# Patient Record
Sex: Male | Born: 1955 | Hispanic: Yes | Marital: Married | Smoking: Never smoker
Health system: Southern US, Community
[De-identification: ages and names within clinical notes are randomized; demographics above are authoritative.]

## PROBLEM LIST (undated history)

## (undated) DIAGNOSIS — N400 Enlarged prostate without lower urinary tract symptoms: Secondary | ICD-10-CM

## (undated) DIAGNOSIS — T7840XA Allergy, unspecified, initial encounter: Secondary | ICD-10-CM

## (undated) DIAGNOSIS — Z8719 Personal history of other diseases of the digestive system: Secondary | ICD-10-CM

## (undated) DIAGNOSIS — E785 Hyperlipidemia, unspecified: Secondary | ICD-10-CM

## (undated) DIAGNOSIS — R350 Frequency of micturition: Secondary | ICD-10-CM

## (undated) HISTORY — PX: TONSILLECTOMY: SUR1361

## (undated) HISTORY — DX: Allergy, unspecified, initial encounter: T78.40XA

## (undated) HISTORY — DX: Hyperlipidemia, unspecified: E78.5

## (undated) HISTORY — PX: SHOULDER SURGERY: SHX246

## (undated) HISTORY — PX: EYE SURGERY: SHX253

---

## 2012-12-17 ENCOUNTER — Ambulatory Visit (INDEPENDENT_AMBULATORY_CARE_PROVIDER_SITE_OTHER): Payer: Managed Care, Other (non HMO) | Admitting: Emergency Medicine

## 2012-12-17 VITALS — BP 110/70 | HR 72 | Temp 98.1°F | Resp 16 | Ht 64.25 in | Wt 139.0 lb

## 2012-12-17 DIAGNOSIS — N400 Enlarged prostate without lower urinary tract symptoms: Secondary | ICD-10-CM

## 2012-12-17 DIAGNOSIS — E782 Mixed hyperlipidemia: Secondary | ICD-10-CM

## 2012-12-17 DIAGNOSIS — Z23 Encounter for immunization: Secondary | ICD-10-CM

## 2012-12-17 DIAGNOSIS — Z0000 Encounter for general adult medical examination without abnormal findings: Secondary | ICD-10-CM

## 2012-12-17 LAB — LIPID PANEL
Cholesterol: 214 mg/dL — ABNORMAL HIGH (ref 0–200)
LDL Cholesterol: 126 mg/dL — ABNORMAL HIGH (ref 0–99)
Total CHOL/HDL Ratio: 3.2 Ratio
Triglycerides: 108 mg/dL (ref <150)
VLDL: 22 mg/dL (ref 0–40)

## 2012-12-17 LAB — POCT URINALYSIS DIPSTICK
Blood, UA: NEGATIVE
Ketones, UA: NEGATIVE
Leukocytes, UA: NEGATIVE
Protein, UA: NEGATIVE
Urobilinogen, UA: 0.2
pH, UA: 8

## 2012-12-17 LAB — POCT CBC
Granulocyte percent: 56.3 %G (ref 37–80)
HCT, POC: 49 % (ref 43.5–53.7)
Lymph, poc: 1.9 (ref 0.6–3.4)
MCH, POC: 32.3 pg — AB (ref 27–31.2)
MCHC: 33.1 g/dL (ref 31.8–35.4)
MID (cbc): 0.5 (ref 0–0.9)
MPV: 10.2 fL (ref 0–99.8)
POC Granulocyte: 3 (ref 2–6.9)
POC LYMPH PERCENT: 34.5 %L (ref 10–50)
POC MID %: 9.2 %M (ref 0–12)
Platelet Count, POC: 234 10*3/uL (ref 142–424)
RDW, POC: 11.7 %

## 2012-12-17 LAB — PSA: PSA: 2.51 ng/mL (ref <=4.00)

## 2012-12-17 LAB — COMPREHENSIVE METABOLIC PANEL
ALT: 34 U/L (ref 0–53)
Alkaline Phosphatase: 56 U/L (ref 39–117)
CO2: 26 mEq/L (ref 19–32)
Creat: 0.81 mg/dL (ref 0.50–1.35)
Potassium: 4 mEq/L (ref 3.5–5.3)
Sodium: 139 mEq/L (ref 135–145)
Total Bilirubin: 1.1 mg/dL (ref 0.3–1.2)
Total Protein: 7.4 g/dL (ref 6.0–8.3)

## 2012-12-17 MED ORDER — ATORVASTATIN CALCIUM 40 MG PO TABS: 40.0000 mg | ORAL_TABLET | Freq: Every day | ORAL | Status: DC

## 2012-12-17 NOTE — Addendum Note (Signed)
Addended by: Carmelina Dane on: 12/17/2012 06:20 PM   Modules accepted: Orders

## 2012-12-17 NOTE — Addendum Note (Signed)
Addended byLevon Hedger A on: 12/17/2012 10:53 AM   Modules accepted: Orders

## 2012-12-17 NOTE — Patient Instructions (Signed)
Lipid Blood Tests Blood lipids are fats found in the circulation. Cholesterol and triglycerides are the two main lipids measured for determining your risk of getting heart and blood vessel diseases. The cholesterol in the blood is attached to two different molecules called lipoproteins. HDL is the beneficial high density lipoprotein cholesterol which reduces coronary risk. Low density lipoprotein cholesterol, the LDL, carries an increased risk for heart and vascular disease when it is high. Most of the body's fat tissue is in the form of triglycerides. Medical conditions that elevate the blood triglyceride level include diabetes, thyroid, kidney, and liver diseases.  A lipid panel usually includes the total cholesterol, LDL, and HDL blood levels. For best results, you should fast overnight before the blood tests are drawn. The following risk factors are generally accepted for different lipids:  LDL - Below 100 means low risk, 130-160 borderline risk, 160-190 high risk, above 190 is considered very high risk.  HDL - Below 40 means high risk, 40-60 average risk, 60 and higher means a reduced risk for heart and blood vessel diseases.  Total cholesterol - Below 200 means low risk, 200-240 is borderline risk, above 240 is higher risk.  Triglycerides - Below 200 means low risk, 200-400 borderline risk, above 400 means increased risk. Many factors contribute to lipid levels including genetics, diet, exercise, body fat and medications. Reducing saturated fats in the diet and increasing fiber with fruits, vegetables and whole grains have been shown to improve blood lipids. Drugs may be prescribed to lower lipids if diet and exercise alone do not help bring the cholesterol levels under control. Document Released: 03/23/2004 Document Revised: 05/08/2011 Document Reviewed: 02/13/2005 [facility] Patient Information 2014 [place], [place].

## 2012-12-17 NOTE — Progress Notes (Signed)
[facility] [address], [place] [address] [phone]  Date:  12/17/2012   Name:  Robert Edwards   DOB:  03-01-1955   MRN:  [id number]  PCP:  Provider Not In System    Chief Complaint: Annual Exam   History of Present Illness:  Robert Edwards is a 57 y.o. very pleasant male patient who presents with the following:  History of hypercholesterolemia and BPH. Is out of his medication as his doctor retired.  No adverse effect of medication.  Has some urge incontinence and has appt with urologist later this month.  Had colonoscopy 2 years ago and 3 polyps were removed.  Not due for redo until 2017.  Denies other complaint or health concern today.   Requests flu shot.  Non smoker.  Works in [employer].  There are no active problems to display for this patient.   Past Medical History  Diagnosis Date  . Allergy     Past Surgical History  Procedure Laterality Date  . Eye surgery    . Tonsillectomy    . Shoulder surgery      History  Substance Use Topics  . Smoking status: Never Smoker   . Smokeless tobacco: Not on file  . Alcohol Use: Not on file    Family History  Problem Relation Age of Onset  . Hypertension Mother   . Heart attack Father   . Cancer Maternal Grandmother     No Known Allergies  Medication list has been reviewed and updated.  No current outpatient prescriptions on file prior to visit.   No current facility-administered medications on file prior to visit.    Review of Systems:  As per HPI, otherwise negative.    Physical Examination: Filed Vitals:   12/17/12 1011  BP: 110/70  Pulse: 72  Temp: 98.1 F (36.7 C)  Resp: 16   Filed Vitals:   12/17/12 1011  Height: 5' 4.25" (1.632 m)  Weight: 139 lb (63.05 kg)   Body mass index is 23.67 kg/(m^2). Ideal Body Weight: Weight in (lb) to have BMI = 25: 146.5  GEN: WDWN, NAD, Non-toxic, A & O x 3 HEENT: Atraumatic, Normocephalic. Neck supple. No masses, No  LAD. Ears and Nose: No external deformity. CV: RRR, No M/G/R. No JVD. No thrill. No extra heart sounds. PULM: CTA B, no wheezes, crackles, rhonchi. No retractions. No resp. distress. No accessory muscle use. ABD: S, NT, ND, +BS. No rebound. No HSM. EXTR: No c/c/e NEURO Normal gait.  PSYCH: Normally interactive. Conversant. Not depressed or anxious appearing.  Calm demeanor.    Assessment and Plan: Wellness examination Labs  Signed,  Phillips Odor, MD

## 2013-01-10 ENCOUNTER — Telehealth: Payer: Self-pay | Admitting: Radiology

## 2013-01-10 ENCOUNTER — Telehealth: Payer: Self-pay

## 2013-01-10 NOTE — Telephone Encounter (Signed)
Faxed PSA to urology at [facility] per their request.

## 2013-01-10 NOTE — Telephone Encounter (Signed)
PATIENT STATES HE HAD A COMPLETE PHYSICAL WITH DR. ANDERSON ABOUT 3 WEEKS AGO. HE RECEIVED A NORMAL LAB LETTER AND IT SAID THAT HIS PSA WAS STILL PENDING AND THAT WE WOULD CONTACT HIM WITH THOSE RESULTS. HOWEVER, HE HAS NEVER HEARD BACK FROM [place]. HE WOULD LIKE TO GET THE RESULTS OF THAT TEST AS WELL. BEST PHONE [phone] (CELL)  PHARMACY CHOICE IS [facility] ON [address] IN [place], [place].   MBC

## 2013-01-10 NOTE — Telephone Encounter (Signed)
PSA normal 2.5 called him to advise.

## 2013-06-24 ENCOUNTER — Ambulatory Visit (INDEPENDENT_AMBULATORY_CARE_PROVIDER_SITE_OTHER): Payer: Managed Care, Other (non HMO) | Admitting: Urology

## 2013-06-24 DIAGNOSIS — N3941 Urge incontinence: Secondary | ICD-10-CM

## 2013-06-24 DIAGNOSIS — N401 Enlarged prostate with lower urinary tract symptoms: Secondary | ICD-10-CM

## 2013-06-24 DIAGNOSIS — R3915 Urgency of urination: Secondary | ICD-10-CM

## 2013-06-24 DIAGNOSIS — N138 Other obstructive and reflux uropathy: Secondary | ICD-10-CM

## 2013-07-02 ENCOUNTER — Other Ambulatory Visit: Payer: Self-pay | Admitting: Emergency Medicine

## 2013-07-22 ENCOUNTER — Ambulatory Visit (INDEPENDENT_AMBULATORY_CARE_PROVIDER_SITE_OTHER): Payer: Managed Care, Other (non HMO) | Admitting: Urology

## 2013-07-22 DIAGNOSIS — N401 Enlarged prostate with lower urinary tract symptoms: Secondary | ICD-10-CM

## 2013-07-22 DIAGNOSIS — N138 Other obstructive and reflux uropathy: Secondary | ICD-10-CM

## 2013-07-28 ENCOUNTER — Other Ambulatory Visit: Payer: Self-pay | Admitting: Urology

## 2013-08-08 ENCOUNTER — Other Ambulatory Visit: Payer: Self-pay | Admitting: Physician Assistant

## 2013-08-10 ENCOUNTER — Other Ambulatory Visit: Payer: Self-pay | Admitting: Physician Assistant

## 2013-08-13 ENCOUNTER — Ambulatory Visit (INDEPENDENT_AMBULATORY_CARE_PROVIDER_SITE_OTHER): Payer: Managed Care, Other (non HMO) | Admitting: Emergency Medicine

## 2013-08-13 VITALS — BP 124/78 | HR 85 | Temp 97.8°F | Resp 18 | Ht 65.0 in | Wt 144.0 lb

## 2013-08-13 DIAGNOSIS — E785 Hyperlipidemia, unspecified: Secondary | ICD-10-CM

## 2013-08-13 LAB — LIPID PANEL
CHOL/HDL RATIO: 3.7 ratio
Cholesterol: 183 mg/dL (ref 0–200)
HDL: 50 mg/dL (ref >39)
LDL CALC: 95 mg/dL (ref 0–99)
Triglycerides: 189 mg/dL — ABNORMAL HIGH (ref <150)
VLDL: 38 mg/dL (ref 0–40)

## 2013-08-13 LAB — COMPREHENSIVE METABOLIC PANEL
ALK PHOS: 70 U/L (ref 39–117)
ALT: 28 U/L (ref 0–53)
AST: 23 U/L (ref 0–37)
Albumin: 4.6 g/dL (ref 3.5–5.2)
BUN: 15 mg/dL (ref 6–23)
CO2: 25 mEq/L (ref 19–32)
CREATININE: 0.81 mg/dL (ref 0.50–1.35)
Calcium: 9.4 mg/dL (ref 8.4–10.5)
Chloride: 102 mEq/L (ref 96–112)
Glucose, Bld: 89 mg/dL (ref 70–99)
Potassium: 4.1 mEq/L (ref 3.5–5.3)
Sodium: 135 mEq/L (ref 135–145)
Total Bilirubin: 0.9 mg/dL (ref 0.2–1.2)
Total Protein: 7.4 g/dL (ref 6.0–8.3)

## 2013-08-13 LAB — CBC WITH DIFFERENTIAL/PLATELET
BASOS ABS: 0.1 10*3/uL (ref 0.0–0.1)
Basophils Relative: 1 % (ref 0–1)
EOS PCT: 5 % (ref 0–5)
Eosinophils Absolute: 0.3 10*3/uL (ref 0.0–0.7)
HCT: 43.2 % (ref 39.0–52.0)
Hemoglobin: 15.1 g/dL (ref 13.0–17.0)
Lymphocytes Relative: 33 % (ref 12–46)
Lymphs Abs: 1.7 10*3/uL (ref 0.7–4.0)
MCH: 31.6 pg (ref 26.0–34.0)
MCHC: 35 g/dL (ref 30.0–36.0)
MCV: 90.4 fL (ref 78.0–100.0)
Monocytes Absolute: 0.5 10*3/uL (ref 0.1–1.0)
Monocytes Relative: 10 % (ref 3–12)
NEUTROS ABS: 2.7 10*3/uL (ref 1.7–7.7)
Neutrophils Relative %: 51 % (ref 43–77)
Platelets: 231 10*3/uL (ref 150–400)
RBC: 4.78 MIL/uL (ref 4.22–5.81)
RDW: 13 % (ref 11.5–15.5)
WBC: 5.3 10*3/uL (ref 4.0–10.5)

## 2013-08-13 MED ORDER — ATORVASTATIN CALCIUM 40 MG PO TABS: ORAL_TABLET | ORAL | Status: DC

## 2013-08-13 NOTE — Progress Notes (Signed)
   Subjective:    Patient ID: Robert Edwards, male    DOB: 25-Sep-1955, 58 y.o.   MRN: [id number]  HPI Chief Complaint  Patient presents with  . rx refills    lipitor    This chart was scribed for Robert ChrisSteven Daub, MD by Andrew Auaven Small, ED Scribe. This patient was seen in room 11 and the patient's care was started at 8:21 AM.  HPI Comments: Robert Edwards is a 58 y.o. male who [facility] for a medication refill regarding lipitor. Pt reports last CBC was October 2014 and states in order for him to get a refill he needs his blood work done. Pt takes lipitor regularly and is running out. Pt states he watches his diets. Pt denies CP and myalgias. He reports cramping to his feet but states he works standing up and outside. Pt stays hydrated. Pt has an enlarged prostate and is soon to have surgery.    There are no active problems to display for this patient.  Past Medical History  Diagnosis Date  . Allergy   . Hyperlipidemia    No Known Allergies Prior to Admission medications   Medication Sig Start Date End Date Taking? Authorizing Provider  atorvastatin (LIPITOR) 40 MG tablet Take 1 tablet (40 mg total) by mouth daily. PATIENT NEEDS OFFICE VISIT FOR ADDITIONAL REFILLS 07/02/13  Yes Eleanore Delia ChimesE Egan, PA-C  finasteride (PROSCAR) 5 MG tablet Take 5 mg by mouth daily.   Yes Historical Provider, MD  tamsulosin (FLOMAX) 0.4 MG CAPS capsule Take by mouth.   Yes Historical Provider, MD   Review of Systems  Cardiovascular: Negative for chest pain.  Musculoskeletal: Negative for myalgias.       Objective:   Physical Exam CONSTITUTIONAL: Well developed/well nourished HEAD: Normocephalic/atraumatic EYES: EOMI/PERRL ENMT: Mucous membranes moist NECK: supple no meningeal signs SPINE:entire spine nontender CV: S1/S2 noted, no murmurs/rubs/gallops noted LUNGS: Lungs are clear to auscultation bilaterally, no apparent distress ABDOMEN: soft, nontender, no rebound or  guarding GU:no cva tenderness NEURO: Pt is awake/alert, moves all extremitiesx4 EXTREMITIES: pulses normal, full ROM SKIN: warm, color normal PSYCH: no abnormalities of mood noted    Assessment & Plan:  Patient presents for refill of Lipitor. Fasting lipid panel was done . He is scheduled for upcoming surgery on his prostate. I personally performed the services described in this documentation, which was scribed in my presence. The recorded information has been reviewed and is accurate.

## 2013-10-14 ENCOUNTER — Encounter (HOSPITAL_COMMUNITY): Payer: Self-pay | Admitting: Pharmacy Technician

## 2013-10-15 ENCOUNTER — Encounter (HOSPITAL_COMMUNITY)
Admission: RE | Admit: 2013-10-15 | Discharge: 2013-10-15 | Disposition: A | Discharge status: Home / Self Care | Payer: Managed Care, Other (non HMO) | Source: Ambulatory Visit | Attending: Urology | Admitting: Urology

## 2013-10-15 ENCOUNTER — Encounter (HOSPITAL_COMMUNITY): Payer: Self-pay

## 2013-10-15 DIAGNOSIS — Z01818 Encounter for other preprocedural examination: Secondary | ICD-10-CM | POA: Insufficient documentation

## 2013-10-15 DIAGNOSIS — N400 Enlarged prostate without lower urinary tract symptoms: Secondary | ICD-10-CM | POA: Diagnosis present

## 2013-10-15 HISTORY — DX: Frequency of micturition: R35.0

## 2013-10-15 HISTORY — DX: Benign prostatic hyperplasia without lower urinary tract symptoms: N40.0

## 2013-10-15 HISTORY — DX: Personal history of other diseases of the digestive system: Z87.19

## 2013-10-15 LAB — CBC
HEMATOCRIT: 43.7 % (ref 39.0–52.0)
Hemoglobin: 15.4 g/dL (ref 13.0–17.0)
MCH: 32 pg (ref 26.0–34.0)
MCHC: 35.2 g/dL (ref 30.0–36.0)
MCV: 90.9 fL (ref 78.0–100.0)
Platelets: 217 10*3/uL (ref 150–400)
RBC: 4.81 MIL/uL (ref 4.22–5.81)
RDW: 11.7 % (ref 11.5–15.5)
WBC: 4.7 10*3/uL (ref 4.0–10.5)

## 2013-10-15 NOTE — Patient Instructions (Signed)
Robert Edwards  10/15/2013                           YOUR PROCEDURE IS SCHEDULED ON: 10/23/13               ENTER THRU [facility] ENTRANCE AND                            FOLLOW  SIGNS TO SHORT STAY CENTER                 ARRIVE AT SHORT STAY AT: 5:30 AM               CALL THIS NUMBER IF ANY PROBLEMS THE DAY OF SURGERY :               [phone]                                REMEMBER:   Do not eat food or drink liquids AFTER MIDNIGHT                  Take these medicines the morning of surgery with               A SIPS OF WATER :  LIPITOR       Do not wear jewelry, make-up   Do not wear lotions, powders, or perfumes.   Do not shave legs or underarms 12 hrs. before surgery (men may shave face)  Do not bring valuables to the hospital.  Contacts, dentures or bridgework may not be worn into surgery.  Leave suitcase in the car. After surgery it may be brought to your room.  For patients admitted to the hospital more than one night, checkout time is            11:00 AM                                                       The day of discharge.   Patients discharged the day of surgery will not be allowed to drive home.            If going home same day of surgery, must have someone stay with you              FIRST 24 hrs at home and arrange for some one to drive you              home from hospital.   ________________________________________________________________________                                                                        [facility] - PREPARING FOR SURGERY  Before surgery, you can play an important role.  Because skin is not sterile, your skin needs to be as free of germs as possible.  You can reduce the number of germs on your skin by washing  with CHG (chlorahexidine gluconate) soap before surgery.  CHG is an antiseptic cleaner which kills germs and bonds with the skin to continue killing germs even after washing. Please DO NOT use  if you have an allergy to CHG or antibacterial soaps.  If your skin becomes reddened/irritated stop using the CHG and inform your nurse when you arrive at Short Stay. Do not shave (including legs and underarms) for at least 48 hours prior to the first CHG shower.  You may shave your face. Please follow these instructions carefully:   1.  Shower with CHG Soap the night before surgery and the  morning of Surgery.   2.  If you choose to wash your hair, wash your hair first as usual with your  normal  Shampoo.   3.  After you shampoo, rinse your hair and body thoroughly to remove the  shampoo.                                         4.  Use CHG as you would any other liquid soap.  You can apply chg directly  to the skin and wash . Gently wash with scrungie or clean wascloth    5.  Apply the CHG Soap to your body ONLY FROM THE NECK DOWN.   Do not use on open                           Wound or open sores. Avoid contact with eyes, ears mouth and genitals (private parts).                        Genitals (private parts) with your normal soap.              6.  Wash thoroughly, paying special attention to the area where your surgery  will be performed.   7.  Thoroughly rinse your body with warm water from the neck down.   8.  DO NOT shower/wash with your normal soap after using and rinsing off  the CHG Soap .                9.  Pat yourself dry with a clean towel.             10.  Wear clean pajamas.             11.  Place clean sheets on your bed the night of your first shower and do not  sleep with pets.  Day of Surgery : Do not apply any lotions/deodorants the morning of surgery.  Please wear clean clothes to the hospital/surgery center.  FAILURE TO FOLLOW THESE INSTRUCTIONS MAY RESULT IN THE CANCELLATION OF YOUR SURGERY    PATIENT SIGNATURE_________________________________  ______________________________________________________________________

## 2013-10-16 ENCOUNTER — Inpatient Hospital Stay (HOSPITAL_COMMUNITY): Admission: RE | Admit: 2013-10-16 | Payer: Managed Care, Other (non HMO) | Source: Ambulatory Visit

## 2013-10-22 NOTE — Anesthesia Preprocedure Evaluation (Addendum)
Anesthesia Evaluation  Patient identified by MRN, date of birth, ID band Patient awake    Reviewed: Allergy & Precautions, H&P , NPO status , Patient's Chart, lab work & pertinent test results  Airway Mallampati: II  TM Distance: >3 FB Neck ROM: full    Dental no notable dental hx. (+) Teeth Intact, Dental Advisory Given   Pulmonary neg pulmonary ROS,  breath sounds clear to auscultation  Pulmonary exam normal       Cardiovascular Exercise Tolerance: Good negative cardio ROS  Rhythm:regular Rate:Normal     Neuro/Psych negative neurological ROS  negative psych ROS   GI/Hepatic negative GI ROS, Neg liver ROS,   Endo/Other  negative endocrine ROS  Renal/GU negative Renal ROS  negative genitourinary   Musculoskeletal   Abdominal   Peds  Hematology negative hematology ROS (+)   Anesthesia Other Findings   Reproductive/Obstetrics negative OB ROS                             Anesthesia Physical Anesthesia Plan  ASA: I  Anesthesia Plan: General   Post-op Pain Management:    Induction: Intravenous  Airway Management Planned: LMA  Additional Equipment:   Intra-op Plan:   Post-operative Plan:   Informed Consent: I have reviewed the patients History and Physical, chart, labs and discussed the procedure including the risks, benefits and alternatives for the proposed anesthesia with the patient or authorized representative who has indicated his/her understanding and acceptance.   Dental Advisory Given  Plan Discussed with: CRNA and Surgeon  Anesthesia Plan Comments:         Anesthesia Quick Evaluation  

## 2013-10-23 ENCOUNTER — Encounter (HOSPITAL_COMMUNITY): Payer: Self-pay | Admitting: *Deleted

## 2013-10-23 ENCOUNTER — Encounter (HOSPITAL_COMMUNITY): Payer: Managed Care, Other (non HMO) | Admitting: Anesthesiology

## 2013-10-23 ENCOUNTER — Observation Stay (HOSPITAL_COMMUNITY)
Admission: RE | Admit: 2013-10-23 | Discharge: 2013-10-24 | Disposition: A | Discharge status: Home / Self Care | Payer: Managed Care, Other (non HMO) | Source: Ambulatory Visit | Attending: Urology | Admitting: Urology

## 2013-10-23 ENCOUNTER — Ambulatory Visit (HOSPITAL_COMMUNITY): Payer: Managed Care, Other (non HMO) | Admitting: Anesthesiology

## 2013-10-23 ENCOUNTER — Encounter (HOSPITAL_COMMUNITY)
Admission: RE | Disposition: A | Discharge status: Home / Self Care | Payer: Self-pay | Source: Ambulatory Visit | Attending: Urology

## 2013-10-23 DIAGNOSIS — N320 Bladder-neck obstruction: Secondary | ICD-10-CM | POA: Diagnosis not present

## 2013-10-23 DIAGNOSIS — N401 Enlarged prostate with lower urinary tract symptoms: Secondary | ICD-10-CM | POA: Diagnosis not present

## 2013-10-23 DIAGNOSIS — E785 Hyperlipidemia, unspecified: Secondary | ICD-10-CM | POA: Insufficient documentation

## 2013-10-23 DIAGNOSIS — N138 Other obstructive and reflux uropathy: Principal | ICD-10-CM | POA: Insufficient documentation

## 2013-10-23 DIAGNOSIS — R350 Frequency of micturition: Secondary | ICD-10-CM | POA: Insufficient documentation

## 2013-10-23 HISTORY — PX: TRANSURETHRAL RESECTION OF PROSTATE: SHX73

## 2013-10-23 SURGERY — TRANSURETHRAL RESECTION OF THE PROSTATE WITH GYRUS INSTRUMENTS
Anesthesia: General

## 2013-10-23 MED ORDER — LEVOFLOXACIN 500 MG PO TABS: 500.0000 mg | ORAL_TABLET | Freq: Every day | ORAL | Status: DC

## 2013-10-23 MED ORDER — DEXAMETHASONE SODIUM PHOSPHATE 10 MG/ML IJ SOLN
INTRAMUSCULAR | Status: AC
  Filled 2013-10-23: qty 1

## 2013-10-23 MED ORDER — DEXAMETHASONE SODIUM PHOSPHATE 10 MG/ML IJ SOLN
INTRAMUSCULAR | Status: DC | PRN
  Administered 2013-10-23: 10 mg via INTRAVENOUS

## 2013-10-23 MED ORDER — MIDAZOLAM HCL 2 MG/2ML IJ SOLN
INTRAMUSCULAR | Status: AC
  Filled 2013-10-23: qty 2

## 2013-10-23 MED ORDER — ONDANSETRON HCL 4 MG/2ML IJ SOLN: 4.0000 mg | INTRAMUSCULAR | Status: DC | PRN

## 2013-10-23 MED ORDER — ONDANSETRON HCL 4 MG/2ML IJ SOLN
INTRAMUSCULAR | Status: AC
  Filled 2013-10-23: qty 2

## 2013-10-23 MED ORDER — DEXTROSE-NACL 5-0.45 % IV SOLN
INTRAVENOUS | Status: DC
  Administered 2013-10-23: 11:00:00 via INTRAVENOUS

## 2013-10-23 MED ORDER — MIDAZOLAM HCL 5 MG/5ML IJ SOLN
INTRAMUSCULAR | Status: DC | PRN
  Administered 2013-10-23: 2 mg via INTRAVENOUS

## 2013-10-23 MED ORDER — FENTANYL CITRATE 0.05 MG/ML IJ SOLN
INTRAMUSCULAR | Status: AC
  Filled 2013-10-23: qty 5

## 2013-10-23 MED ORDER — BELLADONNA ALKALOIDS-OPIUM 16.2-60 MG RE SUPP
1.0000 | Freq: Four times a day (QID) | RECTAL | Status: DC | PRN
Start: 2013-10-23 — End: 2013-10-24

## 2013-10-23 MED ORDER — LIDOCAINE HCL (CARDIAC) 20 MG/ML IV SOLN
INTRAVENOUS | Status: AC
  Filled 2013-10-23: qty 5

## 2013-10-23 MED ORDER — ONDANSETRON HCL 4 MG/2ML IJ SOLN
INTRAMUSCULAR | Status: DC | PRN
  Administered 2013-10-23: 4 mg via INTRAVENOUS

## 2013-10-23 MED ORDER — LIDOCAINE HCL (CARDIAC) 20 MG/ML IV SOLN
INTRAVENOUS | Status: DC | PRN
  Administered 2013-10-23: 60 mg via INTRAVENOUS

## 2013-10-23 MED ORDER — ACETAMINOPHEN 325 MG PO TABS: 650.0000 mg | ORAL_TABLET | ORAL | Status: DC | PRN

## 2013-10-23 MED ORDER — PROPOFOL 10 MG/ML IV BOLUS
INTRAVENOUS | Status: DC | PRN
  Administered 2013-10-23: 160 mg via INTRAVENOUS

## 2013-10-23 MED ORDER — LEVOFLOXACIN 500 MG PO TABS
500.0000 mg | ORAL_TABLET | Freq: Every day | ORAL | Status: DC
  Administered 2013-10-23 – 2013-10-24 (×2): 500 mg via ORAL
  Filled 2013-10-23 (×3): qty 1

## 2013-10-23 MED ORDER — FENTANYL CITRATE 0.05 MG/ML IJ SOLN
INTRAMUSCULAR | Status: AC
  Filled 2013-10-23: qty 2

## 2013-10-23 MED ORDER — PROPOFOL 10 MG/ML IV BOLUS
INTRAVENOUS | Status: AC
  Filled 2013-10-23: qty 20

## 2013-10-23 MED ORDER — LACTATED RINGERS IV SOLN: INTRAVENOUS | Status: DC

## 2013-10-23 MED ORDER — SODIUM CHLORIDE 0.9 % IR SOLN
Status: DC | PRN
  Administered 2013-10-23: 21000 mL

## 2013-10-23 MED ORDER — HYDROCODONE-ACETAMINOPHEN 5-325 MG PO TABS
1.0000 | ORAL_TABLET | ORAL | Status: DC | PRN
  Administered 2013-10-23: 1 via ORAL
  Filled 2013-10-23: qty 1

## 2013-10-23 MED ORDER — ATORVASTATIN CALCIUM 40 MG PO TABS
40.0000 mg | ORAL_TABLET | Freq: Every day | ORAL | Status: DC
  Administered 2013-10-23 – 2013-10-24 (×2): 40 mg via ORAL
  Filled 2013-10-23 (×3): qty 1

## 2013-10-23 MED ORDER — DOCUSATE SODIUM 100 MG PO CAPS
100.0000 mg | ORAL_CAPSULE | Freq: Two times a day (BID) | ORAL | Status: DC
  Administered 2013-10-23 – 2013-10-24 (×3): 100 mg via ORAL
  Filled 2013-10-23 (×5): qty 1

## 2013-10-23 MED ORDER — SODIUM CHLORIDE 0.9 % IR SOLN
3000.0000 mL | Status: DC
  Administered 2013-10-23: 3000 mL

## 2013-10-23 MED ORDER — CEFAZOLIN SODIUM-DEXTROSE 2-3 GM-% IV SOLR
2.0000 g | INTRAVENOUS | Status: AC
  Administered 2013-10-23: 2 g via INTRAVENOUS

## 2013-10-23 MED ORDER — CEFAZOLIN SODIUM-DEXTROSE 2-3 GM-% IV SOLR
INTRAVENOUS | Status: AC
  Filled 2013-10-23: qty 50

## 2013-10-23 MED ORDER — EPHEDRINE SULFATE 50 MG/ML IJ SOLN
INTRAMUSCULAR | Status: AC
  Filled 2013-10-23: qty 1

## 2013-10-23 MED ORDER — FENTANYL CITRATE 0.05 MG/ML IJ SOLN
25.0000 ug | INTRAMUSCULAR | Status: DC | PRN
  Administered 2013-10-23 (×4): 25 ug via INTRAVENOUS

## 2013-10-23 MED ORDER — FENTANYL CITRATE 0.05 MG/ML IJ SOLN
INTRAMUSCULAR | Status: DC | PRN
  Administered 2013-10-23: 25 ug via INTRAVENOUS
  Administered 2013-10-23: 50 ug via INTRAVENOUS
  Administered 2013-10-23 (×2): 25 ug via INTRAVENOUS

## 2013-10-23 MED ORDER — LACTATED RINGERS IV SOLN
INTRAVENOUS | Status: DC | PRN
  Administered 2013-10-23: 07:00:00 via INTRAVENOUS

## 2013-10-23 MED ORDER — SODIUM CHLORIDE 0.9 % IJ SOLN
INTRAMUSCULAR | Status: AC
  Filled 2013-10-23: qty 10

## 2013-10-23 SURGICAL SUPPLY — 19 items
BAG URINE DRAINAGE (UROLOGICAL SUPPLIES) ×3 IMPLANT
BAG URO CATCHER STRL LF (DRAPE) ×3 IMPLANT
CATH FOLEY 3WAY 30CC 22FR (CATHETERS) ×3 IMPLANT
DRAPE CAMERA CLOSED 9X96 (DRAPES) ×3 IMPLANT
ELECT BUTTON HF 24-28F 2 30DE (ELECTRODE) IMPLANT
ELECT HF RESECT BIPO 24F 45 ND (CUTTING LOOP) IMPLANT
ELECT LOOP MED HF 24F 12D (CUTTING LOOP) ×3 IMPLANT
ELECT LOOP MED HF 24F 12D CBL (CLIP) ×3 IMPLANT
EVACUATOR MICROVAS BLADDER (UROLOGICAL SUPPLIES) ×3 IMPLANT
GLOVE BIOGEL M 8.0 STRL (GLOVE) ×3 IMPLANT
GOWN STRL REUS W/TWL XL LVL3 (GOWN DISPOSABLE) ×3 IMPLANT
HOLDER FOLEY CATH W/STRAP (MISCELLANEOUS) ×3 IMPLANT
IV NS IRRIG 3000ML ARTHROMATIC (IV SOLUTION) IMPLANT
KIT ASPIRATION TUBING (SET/KITS/TRAYS/PACK) ×3 IMPLANT
MANIFOLD NEPTUNE II (INSTRUMENTS) ×3 IMPLANT
PACK CYSTO (CUSTOM PROCEDURE TRAY) ×3 IMPLANT
SYR 30ML LL (SYRINGE) ×3 IMPLANT
TUBING CONNECTING 10 (TUBING) ×2 IMPLANT
TUBING CONNECTING 10' (TUBING) ×1

## 2013-10-23 NOTE — Anesthesia Postprocedure Evaluation (Signed)
  Anesthesia Post-op Note  Patient: Robert Edwards  Procedure(s) Performed: Procedure(s) (LRB): TRANSURETHRAL RESECTION OF THE PROSTATE WITH GYRUS INSTRUMENTS (N/A)  Patient Location: PACU  Anesthesia Type: General  Level of Consciousness: awake and alert   Airway and Oxygen Therapy: Patient Spontanous Breathing  Post-op Pain: mild  Post-op Assessment: Post-op Vital signs reviewed, Patient's Cardiovascular Status Stable, Respiratory Function Stable, Patent Airway and No signs of Nausea or vomiting  Last Vitals:  Filed Vitals:   10/23/13 0945  BP:   Pulse:   Temp: 36.5 C  Resp:     Post-op Vital Signs: stable   Complications: No apparent anesthesia complications

## 2013-10-23 NOTE — Discharge Instructions (Signed)
Transurethral Resection of the Prostate ° °Care After ° °Refer to this sheet in the next few weeks. These discharge instructions provide you with general information on caring for yourself after you leave the hospital. Your caregiver may also give you specific instructions. Your treatment has been planned according to the most current medical practices available, but unavoidable complications sometimes occur. If you have any problems or questions after discharge, please call your caregiver. ° °HOME CARE INSTRUCTIONS  ° °Medications °· You may receive medicine for pain management. As your level of discomfort decreases, adjustments in your pain medicines may be made.  °· Take all medicines as directed.  °· You may be given a medicine (antibiotic) to kill germs following surgery. Finish all medicines. Let your caregiver know if you have any side effects or problems from the medicine.  °· If you are on aspirin, it would be best not to restart the aspirin until the blood in the urine clears °Hygiene °· You can take a shower after surgery.  °· You should not take a bath while you still have the urethral catheter. °Activity °· You will be encouraged to get out of bed as much as possible and increase your activity level as tolerated.  °· Spend the first week in and around your home. For 3 weeks, avoid the following:  °· Straining.  °· Running.  °· Strenuous work.  °· Walks longer than a few blocks.  °· Riding for extended periods.  °· Sexual relations.  °· Do not lift heavy objects (more than 20 pounds) for at least 1 month. When lifting, use your arms instead of your abdominal muscles.  °· You will be encouraged to walk as tolerated. Do not exert yourself. Increase your activity level slowly. Remember that it is important to keep moving after an operation of any type. This cuts down on the possibility of developing blood clots.  °· Your caregiver will tell you when you can resume driving and light housework. Discuss this  at your first office visit after discharge. °Diet °· No special diet is ordered after a TURP. However, if you are on a special diet for another medical problem, it should be continued.  °· Normal fluid intake is usually recommended.  °· Avoid alcohol and caffeinated drinks for 2 weeks. They irritate the bladder. Decaffeinated drinks are okay.  °· Avoid spicy foods.  °Bladder Function °· For the first 10 days, empty the bladder whenever you feel a definite desire. Do not try to hold the urine for long periods of time.  °· Urinating once or twice a night even after you are healed is not uncommon.  °· You may see some recurrence of blood in the urine after discharge from the hospital. This usually happens within 2 weeks after the procedure.If this occurs, force fluids again as you did in the hospital and reduce your activity.  °Bowel Function °· You may experience some constipation after surgery. This can be minimized by increasing fluids and fiber in your diet. Drink enough water and fluids to keep your urine clear or pale yellow.  °· A stool softener may be prescribed for use at home. Do not strain to move your bowels.  °· If you are requiring increased pain medicine, it is important that you take stool softeners to prevent constipation. This will help to promote proper healing by reducing the need to strain to move your bowels.  °Sexual Activity °· Semen movement in the opposite direction and into the bladder (  retrograde ejaculation) may occur. Since the semen passes into the bladder, cloudy urine can occur the first time you urinate after intercourse. Or, you may not have an ejaculation during erection. Ask your caregiver when you can resume sexual activity. Retrograde ejaculation and reduced semen discharge should not reduce one's pleasure of intercourse.  °Postoperative Visit °· Arrange the date and time of your after surgery visit with your caregiver.  °Return to Work °· After your recovery is complete, you will  be able to return to work and resume all activities. Your caregiver will inform you when you can return to work.  °Foley Catheter Care °A soft, flexible tube (Foley catheter) may have been placed in your bladder to drain urine and fluid. Follow these instructions: °Taking Care of the Catheter °· Keep the area where the catheter leaves your body clean.  °· Attach the catheter to the leg so there is no tension on the catheter.  °· Keep the drainage bag below the level of the bladder, but keep it OFF the floor.  °· Do not take long soaking baths. Your caregiver will give instructions about showering.  °· Wash your hands before touching ANYTHING related to the catheter or bag.  °· Using mild soap and warm water on a washcloth:  °· Clean the area closest to the catheter insertion site using a circular motion around the catheter.  °· Clean the catheter itself by wiping AWAY from the insertion site for several inches down the tube.  °· NEVER wipe upward as this could sweep bacteria up into the urethra (tube in your body that normally drains the bladder) and cause infection.  °· Place a small amount of sterile lubricant at the tip of the penis where the catheter is entering.  °Taking Care of the Drainage Bags °· Two drainage bags may be taken home: a large overnight drainage bag, and a smaller leg bag which fits underneath clothing.  °· It is okay to wear the overnight bag at any time, but NEVER wear the smaller leg bag at night.  °· Keep the drainage bag well below the level of your bladder. This prevents backflow of urine into the bladder and allows the urine to drain freely.  °· Anchor the tubing to your leg to prevent pulling or tension on the catheter. Use tape or a leg strap provided by the hospital.  °· Empty the drainage bag when it is 1/2 to 3/4 full. Wash your hands before and after touching the bag.  °· Periodically check the tubing for kinks to make sure there is no pressure on the tubing which could restrict  the flow of urine.  °Changing the Drainage Bags °· Cleanse both ends of the clean bag with alcohol before changing.  °· Pinch off the rubber catheter to avoid urine spillage during the disconnection.  °· Disconnect the dirty bag and connect the clean one.  °· Empty the dirty bag carefully to avoid a urine spill.  °· Attach the new bag to the leg with tape or a leg strap.  °Cleaning the Drainage Bags °· Whenever a drainage bag is disconnected, it must be cleaned quickly so it is ready for the next use.  °· Wash the bag in warm, soapy water.  °· Rinse the bag thoroughly with warm water.  °· Soak the bag for 30 minutes in a solution of white vinegar and water (1 cup vinegar to 1 quart warm water).  °· Rinse with warm water.  °SEEK MEDICAL   CARE IF:  °· You have chills or night sweats.  °· You are leaking around your catheter or have problems with your catheter. It is not uncommon to have sporadic leakage around your catheter as a result of bladder spasms. If the leakage stops, there is not much need for concern. If you are uncertain, call your caregiver.  °· You develop side effects that you think are coming from your medicines.  °SEEK IMMEDIATE MEDICAL CARE IF:  °· You are suddenly unable to urinate. Check to see if there are any kinks in the drainage tubing that may cause this. If you cannot find any kinks, call your caregiver immediately. This is an emergency.  °· You develop shortness of breath or chest pains.  °· Bleeding persists or clots develop in your urine.  °· You have a fever.  °· You develop pain in your back or over your lower belly (abdomen).  °· You develop pain or swelling in your legs.  °· Any problems you are having get worse rather than better.  °MAKE SURE YOU:  °· Understand these instructions.  °· Will watch your condition.  °· Will get help right away if you are not doing well or get worse.  °Document Released: 02/13/2005 Document Revised: 10/26/2010 Document Reviewed: 10/07/2008 °[employer]®  Patient Information ©2012 [employer].Transurethral Resection of the Prostate °Care After °Refer to this sheet in the next few weeks. These discharge instructions provide you with general information on caring for yourself after you leave the hospital. Your caregiver may also give you specific instructions. Your treatment has been planned according to the most current medical practices available, but unavoidable complications sometimes occur. If you have any problems or questions after discharge, please call your caregiver. °

## 2013-10-23 NOTE — H&P (Signed)
Urology History and Physical Exam  CC: Difficulty urinating  HPI: 58 year old male presents for TURP. He presented to me about 3 years ago following 2 prostate biopsies (for elevated PSA),  CT scan (for hematuria) as well as a CTT by Dr. Gerome Sam in [place]. HE was still having significant voiding symptoms. HE has been on maximal medical therapy since that time, with continued decreased flow rate and visual obstruction due to a prominent median lobe. HE has been counseled regarding TUR-P, and accepts the risks and complications.  PMH: Past Medical History  Diagnosis Date  . Allergy   . Hyperlipidemia   . H/O hiatal hernia     no problems with reflux DX 1990's  . BPH (benign prostatic hyperplasia)   . Frequency of urination     PSH: Past Surgical History  Procedure Laterality Date  . Eye surgery    . Tonsillectomy    . Shoulder surgery      Allergies: No Known Allergies  Medications: Prescriptions prior to admission  Medication Sig Dispense Refill  . atorvastatin (LIPITOR) 40 MG tablet Take 40 mg by mouth daily.      Marland Kitchen dutasteride (AVODART) 0.5 MG capsule Take 0.5 mg by mouth daily.      . finasteride (PROSCAR) 5 MG tablet Take 5 mg by mouth daily.         Social History: History   Social History  . Marital Status: Married    Spouse Name: N/A    Number of Children: N/A  . Years of Education: N/A   Occupational History  . Not on file.   Social History Main Topics  . Smoking status: Never Smoker   . Smokeless tobacco: Not on file  . Alcohol Use: Yes     Comment: OCCASIONAL  . Drug Use: No  . Sexual Activity: Not on file   Other Topics Concern  . Not on file   Social History Narrative  . No narrative on file    Family History: Family History  Problem Relation Age of Onset  . Hypertension Mother   . Heart attack Father   . Cancer Maternal Grandmother     Review of Systems: Positive: Decreased FOS, frequency, urgency, intermittency Negative:   A  further 10 point review of systems was negative except what is listed in the HPI.                  Physical Exam: @ General: No acute distress.  Awake. Head:  Normocephalic.  Atraumatic. ENT:  EOMI.  Mucous membranes moist Neck:  Supple.  No lymphadenopathy. CV:  S1 present. S2 present. Regular rate. Pulmonary: Equal effort bilaterally.  Clear to auscultation bilaterally. Abdomen: Soft.  Non tender to palpation. Skin:  Normal turgor.  No visible rash. Extremity: No gross deformity of bilateral upper extremities.  No gross deformity of                             lower extremities. Neurologic: Alert. Appropriate mood.    Studies:  No results found for this basename: HGB, WBC, PLT,  in the last 72 hours  No results found for this basename: NA, K, CL, CO2, BUN, CREATININE, CALCIUM, MAGNESIUM, GFRNONAA, GFRAA,  in the last 72 hours   No results found for this basename: PT, INR, APTT,  in the last 72 hours   No components found with this basename: ABG,     Assessment:  BPH with obstruction  Plan: TUR-P

## 2013-10-23 NOTE — Op Note (Signed)
Preoperative diagnosis: 1. Bladder outlet obstruction secondary to BPH  Postoperative diagnosis:  1. Bladder outlet obstruction secondary to BPH  Procedure:  1. Cystoscopy 2. Transurethral resection of the prostate  Surgeon: Bertram Millard. Nikolas Casher, M.D.  Anesthesia: General  Complications: None  Drain: Foley catheter-22 [place], 30 cc balloon three-way  EBL: Minimal  Specimens: 1. Prostate chips  Disposition of specimens: Pathology  Indication: Robert Edwards is a patient with bladder outlet obstruction secondary to benign prostatic hyperplasia. He has been on maximal medical therapy, and failed a cold thermotherapy treatment [place]. He has urodynamic and cystoscopic evidence of obstruction. After reviewing the management options for treatment, he elected to proceed with the above surgical procedure(s). We have discussed the potential benefits and risks of the procedure, side effects of the proposed treatment, the likelihood of the patient achieving the goals of the procedure, and any potential problems that might occur during the procedure or recuperation. Informed consent has been obtained.  Description of procedure:  The patient was identified in the holding area.He received preoperative antibiotics. He was then taken to the operating room. General anesthetic was administered.  The patient was then placed in the dorsal lithotomy position, prepped and draped in the usual sterile fashion. Timeout was then performed.  A resectoscope sheath was placed using the obturator, and the resectoscope, loop and telescope were placed.  The bladder was then systematically examined in its entirety. There was no evidence of  tumors, stones, or other mucosal pathology. There was an obstructive prostate with a large median lobe.  The ureteral orifices were identified and marked so as to be avoided during the procedure.  The prostate adenoma was then resected  utilizing loop cautery resection with the bipolar cutting loop. The median lobe was first resected. The prostate adenoma from the bladder neck back to the verumontanum was resected beginning at the six o'clock position and then extended to include the right and left lobes of the prostate and anterior prostate, respectively. Care was taken not to resect distal to the verumontanum.  Hemostasis was then achieved with the cautery and the bladder was emptied and reinspected with no significant bleeding noted at the end of the procedure.  Resected chips were irrigated from the bladder with the evacuator and sent to pathology.  A 3 way catheter was then placed into the bladder and placed on continuous bladder irrigation.  The patient appeared to tolerate the procedure well and without complications. The patient was able to be awakened and transferred to the recovery unit in satisfactory condition. He tolerated the procedure well.

## 2013-10-23 NOTE — Transfer of Care (Signed)
Immediate Anesthesia Transfer of Care Note  Patient: Robert Edwards  Procedure(s) Performed: Procedure(s): TRANSURETHRAL RESECTION OF THE PROSTATE WITH GYRUS INSTRUMENTS (N/A)  Patient Location: PACU  Anesthesia Type:General  Level of Consciousness: awake, alert  and oriented  Airway & Oxygen Therapy: Patient Spontanous Breathing and Patient connected to face mask oxygen  Post-op Assessment: Report given to PACU RN and Post -op Vital signs reviewed and stable  Post vital signs: Reviewed and stable  Complications: No apparent anesthesia complications

## 2013-10-24 ENCOUNTER — Encounter (HOSPITAL_COMMUNITY): Payer: Self-pay | Admitting: Urology

## 2013-10-24 DIAGNOSIS — N401 Enlarged prostate with lower urinary tract symptoms: Secondary | ICD-10-CM | POA: Diagnosis not present

## 2013-10-24 NOTE — Discharge Summary (Signed)
Patient ID: Robert Edwards MRN: [id number] DOB/AGE: 04/21/1955 58 y.o.  Admit date: 10/23/2013 Discharge date: 10/24/2013  Primary Care Physician:  PROVIDER NOT IN SYSTEM  Discharge Diagnoses:   Present on Admission:  . Hypertrophy of prostate with urinary obstruction and other lower urinary tract symptoms (LUTS)  Consults:  None     Discharge Medications:   Medication List    STOP taking these medications       dutasteride 0.5 MG capsule  Commonly known as:  AVODART     finasteride 5 MG tablet  Commonly known as:  PROSCAR      TAKE these medications       atorvastatin 40 MG tablet  Commonly known as:  LIPITOR  Take 40 mg by mouth daily.     levofloxacin 500 MG tablet  Commonly known as:  LEVAQUIN  Take 1 tablet (500 mg total) by mouth daily.         Significant Diagnostic Studies:  No results found.  Brief H and P: For complete details please refer to admission H and P, but in brief the patient was admitted for TURP as management of obstructive uropathy.  Hospital Course:  Active Problems:   Hypertrophy of prostate with urinary obstruction and other lower urinary tract symptoms (LUTS)   Day of Discharge BP 106/59  Pulse 85  Temp(Src) 98.7 F (37.1 C) (Oral)  Resp 18  Ht  (1.651 m)  Wt 61.689 kg (136 lb)  BMI 22.63 kg/m2  SpO2 96%  No results found for this or any previous visit (from the past 24 hour(s)).  Physical Exam: General: Alert and awake oriented x3 not in any acute distress. HEENT: anicteric sclera, pupils reactive to light and accommodation CVS: S1-S2 clear no murmur rubs or gallops Chest: clear to auscultation bilaterally, no wheezing rales or rhonchi Abdomen: soft nontender, nondistended, normal bowel sounds, no organomegaly Extremities: no cyanosis, clubbing or edema noted bilaterally Neuro: Cranial nerves II-XII intact, no focal neurological deficits  Disposition:  Home  Diet:  No restictions, increased fiber  Activity:   Discussed w/ pt   Disposition and Follow-up:     Discharge Instructions   Discharge patient    Complete by:  As directed               TESTS THAT NEED FOLLOW-UP  Pathology review  DISCHARGE FOLLOW-UP Follow-up Information   Follow up with Chelsea Aus, MD. (as scheduled)    Specialty:  Urology   Contact information:   [address] [place] [address] [phone]       Time spent on Discharge:  10 mins  Signed: Chelsea Aus 10/24/2013, 7:37 AM

## 2014-09-02 ENCOUNTER — Other Ambulatory Visit: Payer: Self-pay | Admitting: Emergency Medicine

## 2014-10-14 ENCOUNTER — Ambulatory Visit (INDEPENDENT_AMBULATORY_CARE_PROVIDER_SITE_OTHER): Payer: Managed Care, Other (non HMO) | Admitting: Emergency Medicine

## 2014-10-14 VITALS — BP 104/88 | HR 67 | Temp 97.4°F | Resp 16 | Ht 64.0 in | Wt 141.0 lb

## 2014-10-14 DIAGNOSIS — Z0000 Encounter for general adult medical examination without abnormal findings: Secondary | ICD-10-CM | POA: Diagnosis not present

## 2014-10-14 DIAGNOSIS — E785 Hyperlipidemia, unspecified: Secondary | ICD-10-CM

## 2014-10-14 DIAGNOSIS — E782 Mixed hyperlipidemia: Secondary | ICD-10-CM

## 2014-10-14 LAB — LIPID PANEL
CHOL/HDL RATIO: 5.7 ratio — AB (ref <=5.0)
Cholesterol: 281 mg/dL — ABNORMAL HIGH (ref 125–200)
HDL: 49 mg/dL (ref >=40)
LDL Cholesterol: 175 mg/dL — ABNORMAL HIGH (ref <130)
Triglycerides: 287 mg/dL — ABNORMAL HIGH (ref <150)
VLDL: 57 mg/dL — ABNORMAL HIGH (ref <30)

## 2014-10-14 LAB — POCT CBC
GRANULOCYTE PERCENT: 60 % (ref 37–80)
HEMATOCRIT: 45.3 % (ref 43.5–53.7)
HEMOGLOBIN: 15.6 g/dL (ref 14.1–18.1)
Lymph, poc: 1.7 (ref 0.6–3.4)
MCH: 31.3 pg — AB (ref 27–31.2)
MCHC: 34.4 g/dL (ref 31.8–35.4)
MCV: 90.9 fL (ref 80–97)
MID (CBC): 0.5 (ref 0–0.9)
MPV: 8.2 fL (ref 0–99.8)
POC Granulocyte: 3.4 (ref 2–6.9)
POC LYMPH PERCENT: 31.2 %L (ref 10–50)
POC MID %: 8.8 % (ref 0–12)
Platelet Count, POC: 208 10*3/uL (ref 142–424)
RBC: 4.98 M/uL (ref 4.69–6.13)
RDW, POC: 12.1 %
WBC: 5.6 10*3/uL (ref 4.6–10.2)

## 2014-10-14 LAB — COMPREHENSIVE METABOLIC PANEL
ALT: 19 U/L (ref 9–46)
AST: 21 U/L (ref 10–35)
Albumin: 4.5 g/dL (ref 3.6–5.1)
Alkaline Phosphatase: 68 U/L (ref 40–115)
BUN: 12 mg/dL (ref 7–25)
CHLORIDE: 103 mmol/L (ref 98–110)
CO2: 26 mmol/L (ref 20–31)
Calcium: 9.7 mg/dL (ref 8.6–10.3)
Creat: 0.76 mg/dL (ref 0.70–1.33)
GLUCOSE: 92 mg/dL (ref 65–99)
POTASSIUM: 4.5 mmol/L (ref 3.5–5.3)
Sodium: 138 mmol/L (ref 135–146)
Total Bilirubin: 0.8 mg/dL (ref 0.2–1.2)
Total Protein: 7.2 g/dL (ref 6.1–8.1)

## 2014-10-14 LAB — HEPATITIS C ANTIBODY: HCV AB: NEGATIVE

## 2014-10-14 MED ORDER — ATORVASTATIN CALCIUM 40 MG PO TABS: ORAL_TABLET | ORAL | Status: DC

## 2014-10-14 NOTE — Patient Instructions (Signed)
nml physical exam Will call with labs ASAP Schedule colonscopy for January 2017

## 2014-10-14 NOTE — Progress Notes (Signed)
Subjective:    Patient ID: Robert Edwards, male    DOB: May 30, 1955, 59 y.o.   MRN: [id number]  HPI    Review of Systems     Objective:   Physical Exam        Assessment & Plan:   [facility] [address], [place] [address] [phone]  Date:  10/14/2014   Name:  Robert Edwards   DOB:  Oct 17, 1955   MRN:  [id number]  PCP:  PROVIDER NOT IN SYSTEM    Chief Complaint: Annual Exam   History of Present Illness:  Robert Edwards is a 59 y.o. very pleasant male patient who presents with the following:  Pt here for complete physical Up to date on immunizations per the pt Up to date on colonoscopy, last one 4.5 years ago with 2 polyps, recommended f/yu jan 2017 No other acute concerns  Patient Active Problem List   Diagnosis Date Noted  . Hypertrophy of prostate with urinary obstruction and other lower urinary tract symptoms (LUTS) 10/23/2013  . Other and unspecified hyperlipidemia 08/13/2013    Past Medical History  Diagnosis Date  . Allergy   . Hyperlipidemia   . H/O hiatal hernia     no problems with reflux DX 1990's  . BPH (benign prostatic hyperplasia)   . Frequency of urination     Past Surgical History  Procedure Laterality Date  . Eye surgery    . Tonsillectomy    . Shoulder surgery    . Transurethral resection of prostate N/A 10/23/2013    Procedure: TRANSURETHRAL RESECTION OF THE PROSTATE WITH GYRUS INSTRUMENTS;  Surgeon: Chelsea Aus, MD;  Location: [facility];  Service: Urology;  Laterality: N/A;    Social History  Substance Use Topics  . Smoking status: Never Smoker   . Smokeless tobacco: None  . Alcohol Use: Yes     Comment: OCCASIONAL    Family History  Problem Relation Age of Onset  . Hypertension Mother   . Heart attack Father   . Cancer Maternal Grandmother     No Known Allergies  Medication list has been reviewed and updated.  No current outpatient prescriptions on file prior to visit.   No current  facility-administered medications on file prior to visit.    Review of Systems:  Gen: no fevers, night sweats, weight loss HEENT: no cough, congestion, rhinorrhea, ear pain, masses on neck, no sore throat Cardio: denies CP, plapitations, tachycardia, bradycardia Pulm: denies SOB, SOB on exertion, accessory muscle use  GI: denies abd pain, N/V/diarrhea, incontinence, hematochezia,  GU: denies penile pain, denies dischrage, denies dysuria, hematuria Musculo: denies new onset weakness, denies pain in extremities, denies sensation loss, endorses nml motor function Neuro: denies focal deficits, denies new onset numbness or tingling Integ: denies rash, denies skin lesions Psych: denies anxiety or depression   Physical Examination: Filed Vitals:   10/14/14 0817  BP: 104/88  Pulse: 67  Temp: 97.4 F (36.3 C)  Resp: 16   Filed Vitals:   10/14/14 0817  Height:  (1.626 m)  Weight: 141 lb (63.957 kg)   Body mass index is 24.19 kg/(m^2). Ideal Body Weight: Weight in (lb) to have BMI = 25: 145.3  GEN: WDWN, NAD, Non-toxic, A & O x 3 HEENT: Atraumatic, Normocephalic. Neck supple. No masses, No LAD. Ears and Nose: No external deformity. CV: RRR, No M/G/R. No JVD. No thrill. No extra heart sounds. PULM: CTAB, no wheezes, crackles, rhonchi.  No retractions. No resp. distress. No accessory muscle use. GU: nml genetalia, no hernia bilaterally ABD: S, NT, ND, +BS. No rebound. No HSM. EXTR: No c/c/e, nml ROM and strength NEURO Normal gait. nml strength testing UE/LE, nml sensation PSYCH: Normally interactive. Conversant. Not depressed or anxious appearing.  Calm demeanor.    Assessment and Plan:  Annual physical exam - Plan: POCT CBC, Comprehensive metabolic panel, Hepatitis C antibody, atorvastatin (LIPITOR) 40 MG tablet, PSA  Hyperlipidemia - Plan: Lipid panel, atorvastatin (LIPITOR) 40 MG tablet  Mixed hyperlipidemia - Plan: Lipid panel  nml PE No acute concerns Will call  with fasting lab results Recommended f/u colonscopy in January, pt to call if needing referral  Signed [name], DO I reviewed the history and physical as well as the assessment and plan and agree with management. Brett Canales a [employer]ynn Banker.D.

## 2014-10-14 NOTE — Progress Notes (Signed)
Subjective:    Patient ID: Robert Edwards, male    DOB: May 30, 1955, 59 y.o.   MRN: [id number]  HPI    Review of Systems     Objective:   Physical Exam        Assessment & Plan:   [facility] [address], [place] [address] [phone]  Date:  10/14/2014   Name:  Robert Edwards   DOB:  Oct 17, 1955   MRN:  [id number]  PCP:  PROVIDER NOT IN SYSTEM    Chief Complaint: Annual Exam   History of Present Illness:  Robert Edwards is a 59 y.o. very pleasant male patient who presents with the following:  Pt here for complete physical Up to date on immunizations per the pt Up to date on colonoscopy, last one 4.5 years ago with 2 polyps, recommended f/yu jan 2017 No other acute concerns  Patient Active Problem List   Diagnosis Date Noted  . Hypertrophy of prostate with urinary obstruction and other lower urinary tract symptoms (LUTS) 10/23/2013  . Other and unspecified hyperlipidemia 08/13/2013    Past Medical History  Diagnosis Date  . Allergy   . Hyperlipidemia   . H/O hiatal hernia     no problems with reflux DX 1990's  . BPH (benign prostatic hyperplasia)   . Frequency of urination     Past Surgical History  Procedure Laterality Date  . Eye surgery    . Tonsillectomy    . Shoulder surgery    . Transurethral resection of prostate N/A 10/23/2013    Procedure: TRANSURETHRAL RESECTION OF THE PROSTATE WITH GYRUS INSTRUMENTS;  Surgeon: Chelsea Aus, MD;  Location: [facility];  Service: Urology;  Laterality: N/A;    Social History  Substance Use Topics  . Smoking status: Never Smoker   . Smokeless tobacco: None  . Alcohol Use: Yes     Comment: OCCASIONAL    Family History  Problem Relation Age of Onset  . Hypertension Mother   . Heart attack Father   . Cancer Maternal Grandmother     No Known Allergies  Medication list has been reviewed and updated.  No current outpatient prescriptions on file prior to visit.   No current  facility-administered medications on file prior to visit.    Review of Systems:  Gen: no fevers, night sweats, weight loss HEENT: no cough, congestion, rhinorrhea, ear pain, masses on neck, no sore throat Cardio: denies CP, plapitations, tachycardia, bradycardia Pulm: denies SOB, SOB on exertion, accessory muscle use  GI: denies abd pain, N/V/diarrhea, incontinence, hematochezia,  GU: denies penile pain, denies dischrage, denies dysuria, hematuria Musculo: denies new onset weakness, denies pain in extremities, denies sensation loss, endorses nml motor function Neuro: denies focal deficits, denies new onset numbness or tingling Integ: denies rash, denies skin lesions Psych: denies anxiety or depression   Physical Examination: Filed Vitals:   10/14/14 0817  BP: 104/88  Pulse: 67  Temp: 97.4 F (36.3 C)  Resp: 16   Filed Vitals:   10/14/14 0817  Height:  (1.626 m)  Weight: 141 lb (63.957 kg)   Body mass index is 24.19 kg/(m^2). Ideal Body Weight: Weight in (lb) to have BMI = 25: 145.3  GEN: WDWN, NAD, Non-toxic, A & O x 3 HEENT: Atraumatic, Normocephalic. Neck supple. No masses, No LAD. Ears and Nose: No external deformity. CV: RRR, No M/G/R. No JVD. No thrill. No extra heart sounds. PULM: CTAB, no wheezes, crackles, rhonchi.  No retractions. No resp. distress. No accessory muscle use. GU: nml genetalia, no hernia bilaterally ABD: S, NT, ND, +BS. No rebound. No HSM. EXTR: No c/c/e, nml ROM and strength NEURO Normal gait. nml strength testing UE/LE, nml sensation PSYCH: Normally interactive. Conversant. Not depressed or anxious appearing.  Calm demeanor.    Assessment and Plan:  Annual physical exam - Plan: POCT CBC, Comprehensive metabolic panel, Hepatitis C antibody, atorvastatin (LIPITOR) 40 MG tablet  Hyperlipidemia - Plan: Lipid panel, atorvastatin (LIPITOR) 40 MG tablet  Mixed hyperlipidemia - Plan: Lipid panel  nml PE No acute concerns Will call with  fasting lab results Recommended f/u colonscopy in January, pt to call if needing referral  Signed [name], DO

## 2014-10-15 ENCOUNTER — Encounter: Payer: Self-pay | Admitting: Family Medicine

## 2014-10-15 ENCOUNTER — Other Ambulatory Visit: Payer: Self-pay | Admitting: Family Medicine

## 2014-10-15 LAB — PSA: PSA: 1.58 ng/mL (ref <=4.00)

## 2015-01-11 ENCOUNTER — Encounter: Payer: Self-pay | Admitting: Podiatry

## 2015-01-11 DIAGNOSIS — R52 Pain, unspecified: Secondary | ICD-10-CM

## 2015-09-02 ENCOUNTER — Ambulatory Visit (INDEPENDENT_AMBULATORY_CARE_PROVIDER_SITE_OTHER): Payer: Managed Care, Other (non HMO) | Admitting: Emergency Medicine

## 2015-09-02 VITALS — BP 122/80 | HR 64 | Temp 97.7°F | Resp 16 | Ht 63.0 in | Wt 137.0 lb

## 2015-09-02 DIAGNOSIS — E785 Hyperlipidemia, unspecified: Secondary | ICD-10-CM | POA: Diagnosis not present

## 2015-09-02 DIAGNOSIS — E782 Mixed hyperlipidemia: Secondary | ICD-10-CM | POA: Diagnosis not present

## 2015-09-02 LAB — LIPID PANEL
Cholesterol: 186 mg/dL (ref 125–200)
HDL: 67 mg/dL (ref >=40)
LDL Cholesterol: 95 mg/dL (ref <130)
TRIGLYCERIDES: 121 mg/dL (ref <150)
Total CHOL/HDL Ratio: 2.8 Ratio (ref <=5.0)
VLDL: 24 mg/dL (ref <30)

## 2015-09-02 MED ORDER — ATORVASTATIN CALCIUM 40 MG PO TABS: ORAL_TABLET | ORAL | Status: DC

## 2015-09-02 NOTE — Patient Instructions (Addendum)
IF you received an x-ray today, you will receive an invoice from [facility]. Please contact [facility] at [phone] with questions or concerns regarding your invoice.   IF you received labwork today, you will receive an invoice from [facility]. Please contact [facility] at [phone] with questions or concerns regarding your invoice.   Our billing staff will not be able to assist you with questions regarding bills from these companies.  You will be contacted with the lab results as soon as they are available. The fastest way to get your results is to activate your My Chart account. Instructions are located on the last page of this paperwork. If you have not heard from us regarding the results in 2 weeks, please contact this office.    Colesterol (Cholesterol) El colesterol es una sustancia blanca y cerosa parecida a la grasa que el organismo necesita en pequeas cantidades. El hgado fabrica todo el colesterol que necesita. La sangre lo transporta desde el hgado a travs de los vasos sanguneos. Los depsitos de colesterol (placa) pueden acumularse en las paredes de los vasos sanguneos, lo que ocasiona el estrechamiento y la rigidez de las arterias. Las placas de colesterol aumentan el riesgo de ataque cardaco e ictus.  Aunque sea muy elevado, la concentracin de colesterol no puede percibirse. La nica forma de saber que tiene colesterol alto es mediante un anlisis de Satic[place]angre. Una vez que se conocen las concentraciones de Oncologistcolesterol, se Tourist information centre managerdebe llevar un registro de los [place]-Los Nietosresultados de Moses Lakelos anlisis. Trabaje con el mdico para Goodrich Corporationmantener las concentraciones en el rango deseado.  QU SIGNIFICAN LOS RESULTADOS?  El colesterol total es una medida general de todo el colesterol en [place] Parksangre.  El LDL es el que se conoce como colesterol malo y es el que se deposita en las paredes de las arterias. Su concentracin debe ser baja.  El HDL es el colesterol  bueno porque limpia las arterias y arrastra el LDL. Su concentracin debe ser alta.  Los triglicridos son grasas que el cuerpo puede quemar ya sea para energa o para almacenamiento. Las concentraciones altas estn estrechamente vinculadas con las enfermedades cardacas. CULES SON LAS CONCENTRACIONES DE COLESTEROL DESEADAS?  El colesterol total por debajo de 200.  El LDL por debajo de 100 en las personas en riesgo y por debajo de 70 en aquellas que corren un riesgo muy alto.  El HDL por Seychellesarriba de 50es bueno y por Seychellesarriba de 60 es lo mejor.  Los triglicridos por debajo de 150. CMO PUEDO BAJAR EL COLESTEROL?  Dieta Siga los programas de alimentacin que el mdico le indique.  Elija el pescado o la carne blanca de pollo y Ok[place]apavo, asados u horneados. Limite los cortes grasos de carne roja, los alimentos fritos y las carnes procesadas, como las salchichas y los embutidos.  Coma gran cantidad de frutas y verduras frescas.  Elija los cereales integrales, los frijoles, las pastas, las papas y los cereales.  Use solamente pequeas cantidades de aceite de oliva, maz o canola.  No coma mantequilla, Blountsvillemayonesa, margarina o aceites de El Ojopalmiste.  Evite los alimentos que contengan grasas trans.  Tome leche semidescremada o sin grasa y coma yogur y quesos bajos en grasas o sin grasa. Evite la Eastman Kodakleche entera, la crema, los Munfordhelados, las yemas de Pinardvillehuevo y los quesos enteros.  Los postres sanos incluyen la torta ngel, los bocadillos de Junction Cityjengibre, las Gaffergalletas con forma de Phillipsburganimales, los caramelos duros, los helados de agua y Curatorel yogur  bajo en grasa o sin grasa. Evite las Newtonmasas, tortas, pasteles y [place]moorgalletas.  Haga actividad fsica. Siga los programas de ejercicios segn las indicaciones del mdico.  Un programa regular ayuda a bajar el colesterol LDL y aumentar el HDL,  y a Art gallery managercontrolar el peso.  Haga cosas que aumenten su nivel de Woodbridgeactividad, por Unadilla Forksejemplo, haga Humboldtjardinera, salga a caminar o suba y baje las  escaleras. Pregntele al mdico cmo puede aumentar la actividad en su vida cotidiana.  Medicamentos. Tome todos los United Parcelmedicamentos como le indic el mdico.  El mdico puede recetarle medicamentos para ayudar a Copybajar el colesterol y reducir el riesgo de enfermedades cardacas.  Si tiene varios factores de riesgo, tal vez tenga que tomar medicamentos, incluso si las concentraciones son normales.   Esta informacin no tiene Theme park managercomo fin reemplazar el consejo del mdico. Asegrese de hacerle al mdico cualquier pregunta que tenga.   Document Released: 11/23/2004 Document Revised: 03/06/2014 Elsevier Interactive Patient Education [employer]2016 Elsevier Inc.

## 2015-09-02 NOTE — Progress Notes (Signed)
Patient ID: Robert Edwards, male   DOB: December 19, 1955, 60 y.o.   MRN: [id number]    By signing my name below, I, Essence Howell, attest that this documentation has been prepared under the direction and in the presence of Collene GobbleSteven A Julus Kelley, MD Electronically Signed: Charline BillsEssence Howell, ED Scribe 09/02/2015 at 10:05 AM.  Chief Complaint:  Chief Complaint  Patient presents with  . Labs Only    UNSPECIFIED   HPI: Robert Edwards is a 60 y.o. male, with a h/o hyperlipidemia, who [facility] work. Pt requests a refill of Lipitor but was told that he needs his cholesterol checked first. Pt has been compliant with medications and denies abdominal pain, rare muscle cramps and any other symptoms at this time. His next CPE is scheduled for October. Triage BP: 122/80. Repeat BP: 120/82.   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   Past Medical History  Diagnosis Date  . Allergy   . Hyperlipidemia   . H/O hiatal hernia     no problems with reflux DX 1990's  . BPH (benign prostatic hyperplasia)   . Frequency of urination    Past Surgical History  Procedure Laterality Date  . Eye surgery    . Tonsillectomy    . Shoulder surgery    . Transurethral resection of prostate N/A 10/23/2013    Procedure: TRANSURETHRAL RESECTION OF THE PROSTATE WITH GYRUS INSTRUMENTS;  Surgeon: Chelsea AusStephen M Dahlstedt, MD;  Location: [facility];  Service: Urology;  Laterality: N/A;   Social History   Social History  . Marital Status: Married    Spouse Name: N/A  . Number of Children: N/A  . Years of Education: N/A   Social History Main Topics  . Smoking status: Never Smoker   . Smokeless tobacco: None  . Alcohol Use: Yes     Comment: OCCASIONAL  . Drug Use: No  . Sexual Activity: Not Asked   Other Topics Concern  . None   Social History Narrative   Family History  Problem Relation Age of Onset  . Hypertension Mother   . Heart attack Father   . Cancer Maternal Grandmother    No Known Allergies Prior  to Admission medications   Medication Sig Start Date End Date Taking? Authorizing Provider  atorvastatin (LIPITOR) 40 MG tablet TAKE 1 TABLET BY MOUTH EVERY DAY.  "OV NEEDED FOR REFILLS" 10/14/14  Yes Sharlet SalinaBenjamin Adams-Doolittle, MD   ROS: The patient denies fevers, chills, night sweats, unintentional weight loss, chest pain, palpitations, wheezing, dyspnea on exertion, nausea, vomiting, abdominal pain, dysuria, hematuria, melena, numbness, weakness, or tingling.   All other systems have been reviewed and were otherwise negative with the exception of those mentioned in the HPI and as above.    PHYSICAL EXAM: Filed Vitals:   09/02/15 0944  BP: 122/80  Pulse: 64  Temp: 97.7 F (36.5 C)  Resp: 16   Body mass index is 24.27 kg/(m^2).  General: Alert, no acute distress HEENT:  Normocephalic, atraumatic, oropharynx patent. [facility] Cardiovascular:  Regular rate and rhythm, no rubs murmurs or gallops. No Carotid bruits, radial pulse intact. No pedal edema.  Respiratory: Clear to auscultation bilaterally. No wheezes, rales, or rhonchi. No cyanosis, no use of accessory musculature Abdominal: No organomegaly, abdomen is soft and non-tender, positive bowel sounds. No masses. Musculoskeletal: Gait intact. No edema, tenderness Skin: No rashes. Neurologic: Facial musculature symmetric. Psychiatric: Patient acts appropriately throughout our interaction. Lymphatic: No cervical or submandibular lymphadenopathy  LABS:  EKG/XRAY:   Primary read interpreted [facility].  ASSESSMENT/PLAN: Lipids were done on his Lipitor was refilled he will return to clinic in the fall for his regular physical exam.I personally performed the services described in this documentation, which was scribed in my presence. The recorded information has been reviewed and is accurate. Gross sideeffects, risk and benefits, and alternatives of medications d/w patient. Patient is aware that all medications have  potential sideeffects and we are unable to predict every sideeffect or drug-drug interaction that may occur.  Lesle ChrisSteven Arleta Ostrum MD 09/02/2015 9:52 AM

## 2015-09-10 ENCOUNTER — Telehealth: Payer: Self-pay

## 2015-09-10 NOTE — Telephone Encounter (Signed)
-----   Message from Collene GobbleSteven A Daub, MD sent at 09/02/2015  3:51 PM EDT ----- Lipids are much improved on medication

## 2015-09-10 NOTE — Telephone Encounter (Signed)
Spoke with pt, gave message per Dr. Cleta Albertsaub Pt requested letter with results.  Sent

## 2016-09-27 ENCOUNTER — Other Ambulatory Visit: Payer: Self-pay | Admitting: Emergency Medicine

## 2016-09-27 DIAGNOSIS — E785 Hyperlipidemia, unspecified: Secondary | ICD-10-CM

## 2016-10-30 ENCOUNTER — Other Ambulatory Visit: Payer: Self-pay | Admitting: Physician Assistant

## 2016-10-30 DIAGNOSIS — E785 Hyperlipidemia, unspecified: Secondary | ICD-10-CM

## 2017-06-22 DIAGNOSIS — M79676 Pain in unspecified toe(s): Secondary | ICD-10-CM

## 2019-09-15 ENCOUNTER — Ambulatory Visit (INDEPENDENT_AMBULATORY_CARE_PROVIDER_SITE_OTHER): Payer: No Typology Code available for payment source | Admitting: Podiatry

## 2019-09-15 ENCOUNTER — Encounter: Payer: Self-pay | Admitting: Podiatry

## 2019-09-15 ENCOUNTER — Other Ambulatory Visit: Payer: Self-pay

## 2019-09-15 ENCOUNTER — Ambulatory Visit (INDEPENDENT_AMBULATORY_CARE_PROVIDER_SITE_OTHER): Payer: No Typology Code available for payment source

## 2019-09-15 DIAGNOSIS — G8929 Other chronic pain: Secondary | ICD-10-CM

## 2019-09-15 DIAGNOSIS — M79672 Pain in left foot: Secondary | ICD-10-CM | POA: Diagnosis not present

## 2019-09-15 DIAGNOSIS — M779 Enthesopathy, unspecified: Secondary | ICD-10-CM | POA: Diagnosis not present

## 2019-09-15 MED ORDER — MELOXICAM 15 MG PO TABS: 15.0000 mg | ORAL_TABLET | Freq: Every day | ORAL | 0 refills | Status: DC

## 2019-09-19 NOTE — Progress Notes (Signed)
Subjective:   Patient ID: Robert Edwards, male   DOB: 64 y.o.   MRN: [id number]   HPI 64 year old male presents the office for concerns of discomfort to his left foot.  He points to the lateral aspect from the sinus tarsi where he has majority discomfort.  This is been a chronic issue when he is more tender at the end of the day for being on his feet all day.  He stands a lot at work.  Does have orthotics that have been helpful but he states that there several years old may been refurbished a couple of times.  He had an injury when he was 64 years old with no recent injury.  He does get intermittent swelling.   Review of Systems  All other systems reviewed and are negative.  Past Medical History:  Diagnosis Date  . Allergy   . BPH (benign prostatic hyperplasia)   . Frequency of urination   . H/O hiatal hernia    no problems with reflux DX 1990's  . Hyperlipidemia     Past Surgical History:  Procedure Laterality Date  . EYE SURGERY    . SHOULDER SURGERY    . TONSILLECTOMY    . TRANSURETHRAL RESECTION OF PROSTATE N/A 10/23/2013   Procedure: TRANSURETHRAL RESECTION OF THE PROSTATE WITH GYRUS INSTRUMENTS;  Surgeon: Chelsea Aus, MD;  Location: [facility];  Service: Urology;  Laterality: N/A;     Current Outpatient Medications:  .  atorvastatin (LIPITOR) 40 MG tablet, Take 1 tablet (40 mg total) by mouth once. TAKE 1 TABLET BY MOUTH EVERY DAY.  Office visit needed, Disp: 90 tablet, Rfl: 0 .  meloxicam (MOBIC) 15 MG tablet, Take 1 tablet (15 mg total) by mouth daily., Disp: 30 tablet, Rfl: 0  No Known Allergies       Objective:  Physical Exam  General: AAO x3, NAD  Dermatological: Skin is warm, dry and supple bilateral. Nails x 10 are well manicured; remaining integument appears unremarkable at this time. There are no open sores, no preulcerative lesions, no rash or signs of infection present.  Vascular: Dorsalis Pedis artery and Posterior Tibial artery pedal pulses are 2/4  bilateral with immedate capillary fill time. There is no pain with calf compression, swelling, warmth, erythema.   Neruologic: Grossly intact via light touch bilateral.   Musculoskeletal: There is tenderness palpation directly to the lateral aspect on the sinus tarsi and decreased range of motion of subtalar joint.  No pain or crepitation with ankle joint range of motion.  No significant pain on the peroneal tendons.  No medial ankle pain.  No other areas of pinpoint tenderness.  Muscular strength 5/5 in all groups tested bilateral.  Gait: Unassisted, Nonantalgic.       Assessment:   64 year old male left foot subtalar joint capsulitis    Plan:  -Treatment options discussed including all alternatives, risks, and complications -Etiology of symptoms were discussed -X-rays were obtained and reviewed with the patient.  No evidence of acute fracture or stress fracture.  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 -Steroid injection performed.  See procedure note below.  Discussed new orthotics he can consider this. Prescribed mobic. Discussed side effects of the medication and directed to stop if any are to occur and call the office.   Procedure: Injection intermediate joint Discussed alternatives, risks, complications and verbal consent was obtained.  Location: Left sinus tarsi Skin Prep: Betadine. Injectate: 0.5cc 0.5% marcaine plain, 0.5 cc 2% lidocaine plain and, 1  cc kenalog 10. Disposition: Patient tolerated procedure well. Injection site dressed with a band-aid.  Post-injection care was discussed and return precautions discussed.    Vivi Barrack DPM

## 2019-10-11 ENCOUNTER — Other Ambulatory Visit: Payer: Self-pay | Admitting: Podiatry

## 2019-10-20 ENCOUNTER — Telehealth: Payer: Self-pay | Admitting: *Deleted

## 2019-10-20 NOTE — Telephone Encounter (Signed)
[facility] pharmacy faxed over a request for the meloxicam and per Dr Ardelle Anton refilled the meloxicam #30 tablets with no refills. Misty Stanley

## 2019-11-10 ENCOUNTER — Ambulatory Visit: Payer: No Typology Code available for payment source | Admitting: Podiatry

## 2020-01-06 ENCOUNTER — Ambulatory Visit: Payer: No Typology Code available for payment source | Admitting: Podiatry

## 2020-02-03 ENCOUNTER — Other Ambulatory Visit: Payer: Self-pay

## 2020-02-03 ENCOUNTER — Ambulatory Visit: Payer: No Typology Code available for payment source | Admitting: Podiatry

## 2020-02-03 DIAGNOSIS — G8929 Other chronic pain: Secondary | ICD-10-CM | POA: Diagnosis not present

## 2020-02-03 DIAGNOSIS — M79672 Pain in left foot: Secondary | ICD-10-CM | POA: Diagnosis not present

## 2020-02-03 DIAGNOSIS — M779 Enthesopathy, unspecified: Secondary | ICD-10-CM

## 2020-02-03 NOTE — Progress Notes (Signed)
Subjective: 64 year old male presents the office today for follow-up evaluation of left foot pain.  He states the injection helped quite a bit and resolved the pain to the outside.  Afterwards he started get some pain in the medial aspect ankle for couple weeks but is resolved.  He currently has no pain.  He wants to consider new orthotics as his orthotics are several years old and has had modifications to them.  Seems to be several hours a day at work.  There was injury or falls.  No swelling.  No other concerns. Denies any systemic complaints such as fevers, chills, nausea, vomiting. No acute changes since last appointment, and no other complaints at this time.   Objective: AAO x3, NAD DP/PT pulses palpable bilaterally, CRT less than 3 seconds At this time there is no tenderness palpation on the sinus tarsi, subtalar joint.  No pain on the flexor tendons of the ankle.  No pain with ankle, subtalar joint range of motion.  MMT 5/5.  No edema bilaterally. No pain with calf compression, swelling, warmth, erythema  Assessment: Capsulitis, tendinitis  Plan: -All treatment options discussed with the patient including all alternatives, risks, complications.  -At this point he is doing well the injections were helpful.  However his orthotics are worn out and wearing continue orthotics Support his feet.  He was seen today by Raiford Noble for measurement of inserts -Patient encouraged to call the office with any questions, concerns, change in symptoms.   RTC 4 weeks to PUO  Vivi Barrack DPM

## 2020-02-05 ENCOUNTER — Telehealth: Payer: Self-pay | Admitting: Podiatry

## 2020-02-05 NOTE — Telephone Encounter (Signed)
Pt left message asking for a call back because he had a question about the orthotics that were ordered.  I called several times and it just hung up on me. Pt then called back and he stated his phone did not recognize our number so it was blocking it. He wanted to know if the orthotics were going to be billed for this year.   I told pt yes it was already filed and pending with the insurance company. He said thank you.

## 2020-03-04 ENCOUNTER — Encounter: Payer: No Typology Code available for payment source | Admitting: Orthotics

## 2020-03-16 ENCOUNTER — Encounter: Payer: No Typology Code available for payment source | Admitting: Orthotics

## 2020-03-23 ENCOUNTER — Other Ambulatory Visit: Payer: Self-pay

## 2020-03-23 ENCOUNTER — Ambulatory Visit (INDEPENDENT_AMBULATORY_CARE_PROVIDER_SITE_OTHER): Payer: No Typology Code available for payment source | Admitting: Podiatry

## 2020-03-23 ENCOUNTER — Ambulatory Visit: Payer: No Typology Code available for payment source | Admitting: Orthotics

## 2020-03-23 DIAGNOSIS — M779 Enthesopathy, unspecified: Secondary | ICD-10-CM

## 2020-03-23 DIAGNOSIS — G8929 Other chronic pain: Secondary | ICD-10-CM

## 2020-03-23 DIAGNOSIS — M79672 Pain in left foot: Secondary | ICD-10-CM

## 2020-03-23 MED ORDER — TRIAMCINOLONE ACETONIDE 10 MG/ML IJ SUSP
10.0000 mg | Freq: Once | INTRAMUSCULAR | Status: AC
  Administered 2020-03-23: 10 mg

## 2020-03-23 NOTE — Progress Notes (Signed)
Patient picked up f/o and was pleased with fit, comfort, and function.  Worked well with footwear.  Told of rbeak in period and how to report any issues.  

## 2020-03-25 ENCOUNTER — Encounter: Payer: No Typology Code available for payment source | Admitting: Orthotics

## 2020-03-25 ENCOUNTER — Ambulatory Visit: Payer: No Typology Code available for payment source | Admitting: Podiatry

## 2020-03-28 NOTE — Progress Notes (Signed)
Subjective: 65 year old male presents the office today requesting an injection to his left foot.  States the injection last appointment we did in December was helpful he would like to have another one.  He picks up orthotics today as well.  No recent injury or falls or changes otherwise since last saw him. Denies any systemic complaints such as fevers, chills, nausea, vomiting. No acute changes since last appointment, and no other complaints at this time.   Objective: AAO x3, NAD DP/PT pulses palpable bilaterally, CRT less than 3 seconds Tenderness palpation mostly along the sinus tarsi to lateral aspect the left foot.  There is no edema, erythema.  No areas of pinpoint tenderness.  MMT 5/5.  Ankle, subtalar range of motion intact but any restrictions. No pain with calf compression, swelling, warmth, erythema  Assessment: 65 year old male capsulitis left foot  Plan: -All treatment options discussed with the patient including all alternatives, risks, complications.  -Steroid injection performed.  See procedure note below. -Orthotics were dispensed today by our pedorthotist, Rick.  Oral and written break-in instructions provided to the patient.  Discussed with him to monitor feet daily if there is any issues to let me know or if they are not helping or causing discomfort we can modify them. -Patient encouraged to call the office with any questions, concerns, change in symptoms.    Procedure: Injection Intermediate Joint  Discussed alternatives, risks, complications and verbal consent was obtained.  Location: Left sinus tarsi Skin Prep: Betadine  Injectate: 0.5cc 0.5% marcaine plain, 0.5 cc 2% lidocaine plain and, 1 cc kenalog 10. Disposition: Patient tolerated procedure well. Injection site dressed with a band-aid.  Post-injection care was discussed and return precautions discussed.   Vivi Barrack DPM

## 2020-04-16 ENCOUNTER — Other Ambulatory Visit: Payer: Self-pay | Admitting: Chiropractic Medicine

## 2020-04-16 DIAGNOSIS — M436 Torticollis: Secondary | ICD-10-CM

## 2020-04-17 ENCOUNTER — Other Ambulatory Visit: Payer: Self-pay

## 2020-04-17 ENCOUNTER — Ambulatory Visit (INDEPENDENT_AMBULATORY_CARE_PROVIDER_SITE_OTHER): Payer: Self-pay

## 2020-04-17 DIAGNOSIS — M436 Torticollis: Secondary | ICD-10-CM

## 2020-04-17 DIAGNOSIS — M50321 Other cervical disc degeneration at C4-C5 level: Secondary | ICD-10-CM

## 2020-04-17 DIAGNOSIS — M50322 Other cervical disc degeneration at C5-C6 level: Secondary | ICD-10-CM

## 2020-04-17 DIAGNOSIS — M5031 Other cervical disc degeneration,  high cervical region: Secondary | ICD-10-CM

## 2020-04-17 DIAGNOSIS — M50323 Other cervical disc degeneration at C6-C7 level: Secondary | ICD-10-CM

## 2020-05-04 ENCOUNTER — Other Ambulatory Visit: Payer: Self-pay

## 2020-05-04 ENCOUNTER — Ambulatory Visit: Payer: No Typology Code available for payment source | Admitting: Podiatry

## 2020-05-04 DIAGNOSIS — G8929 Other chronic pain: Secondary | ICD-10-CM

## 2020-05-04 DIAGNOSIS — M779 Enthesopathy, unspecified: Secondary | ICD-10-CM | POA: Diagnosis not present

## 2020-05-04 DIAGNOSIS — M79672 Pain in left foot: Secondary | ICD-10-CM | POA: Diagnosis not present

## 2020-05-04 NOTE — Progress Notes (Signed)
Subjective: 65 year old male presents the office today for Evaluation of left foot pain, capsulitis.  He states that after the orthotics he has some discomfort to his feet but has is that he needs to them they are feeling better.  He does feel that overall the foot feels better but after being on his feet for several days  the discomfort starts to come. Denies any systemic complaints such as fevers, chills, nausea, vomiting. No acute changes since last appointment, and no other complaints at this time.   Objective: AAO x3, NAD DP/PT pulses palpable bilaterally, CRT less than 3 seconds There is mild tenderness palpation on the lateral aspect along the sinus tarsi.  There is no edema, erythema.  No area of pinpoint tenderness identified.  MMT 5/5. No pain with calf compression, swelling, warmth, erythema  Assessment: 65 year old male with capsulitis left foot  Plan: -All treatment options discussed with the patient including all alternatives, risks, complications.  Upon evaluation his orthotics are fitting well however he is still slightly valgus.  I added the medial post to help hold the Achilles, calcaneus neutral.  Hope this will help take some pressure off the sinus tarsi as well.  At this works I can send the orthotics back to incorporate the post.  Otherwise remove this.  Continue anti-inflammatories as needed.  Hold off on a steroid injection today. -Patient encouraged to call the office with any questions, concerns, change in symptoms.   Vivi Barrack DPM

## 2020-06-22 ENCOUNTER — Ambulatory Visit: Payer: No Typology Code available for payment source | Admitting: Podiatry

## 2021-04-27 ENCOUNTER — Other Ambulatory Visit (HOSPITAL_COMMUNITY): Payer: Self-pay | Admitting: Cardiovascular Disease

## 2021-04-27 ENCOUNTER — Other Ambulatory Visit: Payer: Self-pay | Admitting: Cardiovascular Disease

## 2021-04-27 DIAGNOSIS — R079 Chest pain, unspecified: Secondary | ICD-10-CM

## 2021-05-09 ENCOUNTER — Telehealth (HOSPITAL_COMMUNITY): Payer: Self-pay | Admitting: Emergency Medicine

## 2021-05-09 ENCOUNTER — Encounter (HOSPITAL_COMMUNITY): Payer: Self-pay

## 2021-05-09 DIAGNOSIS — R079 Chest pain, unspecified: Secondary | ICD-10-CM

## 2021-05-09 MED ORDER — METOPROLOL TARTRATE 100 MG PO TABS: 100.0000 mg | ORAL_TABLET | Freq: Once | ORAL | 0 refills | Status: DC

## 2021-05-09 NOTE — Telephone Encounter (Signed)
Reaching out to patient to offer assistance regarding upcoming cardiac imaging study; pt verbalizes understanding of appt date/time, parking situation and where to check in, pre-test NPO status and medications ordered, and verified current allergies; name and call back number provided for further questions should they arise ?Akshitha Culmer RN Navigator Cardiac Imaging ?[facility] ?[phone] office ?[phone] cell ? ?Denies iv issues  ?100mg metoprolol tartrate  ?Arrival 1130 ?

## 2021-05-10 ENCOUNTER — Ambulatory Visit (HOSPITAL_COMMUNITY)
Admission: RE | Admit: 2021-05-10 | Discharge: 2021-05-10 | Disposition: A | Discharge status: Home / Self Care | Payer: No Typology Code available for payment source | Source: Ambulatory Visit | Attending: Cardiology | Admitting: Cardiology

## 2021-05-10 ENCOUNTER — Other Ambulatory Visit: Payer: Self-pay | Admitting: Cardiology

## 2021-05-10 ENCOUNTER — Other Ambulatory Visit: Payer: Self-pay

## 2021-05-10 ENCOUNTER — Ambulatory Visit (HOSPITAL_COMMUNITY)
Admission: RE | Admit: 2021-05-10 | Discharge: 2021-05-10 | Disposition: A | Discharge status: Home / Self Care | Payer: No Typology Code available for payment source | Source: Ambulatory Visit | Attending: Family Medicine | Admitting: Family Medicine

## 2021-05-10 DIAGNOSIS — R931 Abnormal findings on diagnostic imaging of heart and coronary circulation: Secondary | ICD-10-CM

## 2021-05-10 DIAGNOSIS — R079 Chest pain, unspecified: Secondary | ICD-10-CM | POA: Diagnosis present

## 2021-05-10 DIAGNOSIS — I2510 Atherosclerotic heart disease of native coronary artery without angina pectoris: Secondary | ICD-10-CM

## 2021-05-10 MED ORDER — NITROGLYCERIN 0.4 MG SL SUBL
0.8000 mg | SUBLINGUAL_TABLET | Freq: Once | SUBLINGUAL | Status: AC
  Administered 2021-05-10: 0.8 mg via SUBLINGUAL

## 2021-05-10 MED ORDER — IOHEXOL 350 MG/ML SOLN
95.0000 mL | Freq: Once | INTRAVENOUS | Status: AC | PRN
  Administered 2021-05-10: 95 mL via INTRAVENOUS

## 2021-05-10 MED ORDER — NITROGLYCERIN 0.4 MG SL SUBL
SUBLINGUAL_TABLET | SUBLINGUAL | Status: AC
  Filled 2021-05-10: qty 2

## 2021-06-14 ENCOUNTER — Ambulatory Visit: Payer: No Typology Code available for payment source | Admitting: Cardiovascular Disease

## 2021-11-01 ENCOUNTER — Ambulatory Visit (INDEPENDENT_AMBULATORY_CARE_PROVIDER_SITE_OTHER): Payer: No Typology Code available for payment source | Admitting: Plastic Surgery

## 2021-11-01 ENCOUNTER — Encounter: Payer: Self-pay | Admitting: Plastic Surgery

## 2021-11-01 DIAGNOSIS — L819 Disorder of pigmentation, unspecified: Secondary | ICD-10-CM | POA: Diagnosis not present

## 2021-11-01 NOTE — Addendum Note (Signed)
Addended by: Angelita Ingles on: 11/01/2021 08:44 AM   Modules accepted: Orders

## 2021-11-01 NOTE — Progress Notes (Signed)
     Patient ID: Robert Edwards, male    DOB: 1955-12-07, 66 y.o.   MRN: [id number]   Chief Complaint  Patient presents with   Skin Problem    The patient is a 66 year old male here for evaluation of his face.  He is a [employer] 4 out of 5.  He had several areas addressed by the dermatologist with either burning or freezing.  He has some residual hyperpigmentation.  He would like to see if there is anything he can do to resolve it.  He is otherwise in good health.  He tries to be careful about not getting sun exposure.  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    Review of Systems  Constitutional: Negative.   Eyes: Negative.   Respiratory: Negative.    Cardiovascular: Negative.   Gastrointestinal: Negative.   Endocrine: Negative.   Genitourinary: Negative.   Musculoskeletal: Negative.     Past Medical History:  Diagnosis Date   Allergy    BPH (benign prostatic hyperplasia)    Frequency of urination    H/O hiatal hernia    no problems with reflux DX 1990's   Hyperlipidemia     Past Surgical History:  Procedure Laterality Date   EYE SURGERY     SHOULDER SURGERY     TONSILLECTOMY     TRANSURETHRAL RESECTION OF PROSTATE N/A 10/23/2013   Procedure: TRANSURETHRAL RESECTION OF THE PROSTATE WITH GYRUS INSTRUMENTS;  Surgeon: Chelsea Aus, MD;  Location: [facility];  Service: Urology;  Laterality: N/A;      Current Outpatient Medications:    atorvastatin (LIPITOR) 40 MG tablet, Take 1 tablet (40 mg total) by mouth once. TAKE 1 TABLET BY MOUTH EVERY DAY.  Office visit needed, Disp: 90 tablet, Rfl: 0   meloxicam (MOBIC) 15 MG tablet, TAKE 1 TABLET BY MOUTH EVERY DAY, Disp: 30 tablet, Rfl: 0   metoprolol tartrate (LOPRESSOR) 100 MG tablet, Take 1 tablet (100 mg total) by mouth once for 1 dose. Please take one time dose 100mg  metoprolol tartrate 2 hr prior to cardiac CT for HR control IF HR >55bpm., Disp: 1 tablet, Rfl: 0   Objective:   Vitals:   11/01/21 0837  BP:  115/76  Pulse: 77    Physical Exam Vitals reviewed.  Constitutional:      Appearance: Normal appearance.  Musculoskeletal:        General: No deformity.  Skin:    General: Skin is warm.     Coloration: Skin is not jaundiced or pale.     Findings: Lesion present. No bruising or erythema.  Neurological:     Mental Status: He is alert.  Psychiatric:        Mood and Affect: Mood normal.        Behavior: Behavior normal.        Thought Content: Thought content normal.        Judgment: Judgment normal.     Assessment & Plan:  Hyperpigmentation of skin  Recommend Sente cream and BBL laser.  The patient wants to try the Sente cream first and follow-up in 3 months.  I think that is a great option and I will see him then.  Pictures were obtained of the patient and placed in the chart with the patient's or guardian's permission.   01/01/22 Kennith Morss, DO

## 2021-11-17 ENCOUNTER — Other Ambulatory Visit: Payer: Self-pay | Admitting: Urology

## 2021-11-17 DIAGNOSIS — R972 Elevated prostate specific antigen [PSA]: Secondary | ICD-10-CM

## 2021-12-06 ENCOUNTER — Ambulatory Visit
Admission: RE | Admit: 2021-12-06 | Discharge: 2021-12-06 | Disposition: A | Discharge status: Home / Self Care | Payer: No Typology Code available for payment source | Source: Ambulatory Visit | Attending: Urology | Admitting: Urology

## 2021-12-06 DIAGNOSIS — R972 Elevated prostate specific antigen [PSA]: Secondary | ICD-10-CM

## 2021-12-06 MED ORDER — GADOBENATE DIMEGLUMINE 529 MG/ML IV SOLN
13.0000 mL | Freq: Once | INTRAVENOUS | Status: AC | PRN
  Administered 2021-12-06: 13 mL via INTRAVENOUS

## 2022-01-31 ENCOUNTER — Ambulatory Visit: Payer: No Typology Code available for payment source | Admitting: Plastic Surgery

## 2022-07-14 ENCOUNTER — Encounter: Payer: Self-pay | Admitting: Plastic Surgery

## 2022-07-14 ENCOUNTER — Ambulatory Visit: Payer: Medicare HMO | Admitting: Plastic Surgery

## 2022-07-14 VITALS — BP 102/72 | HR 73 | Ht 65.0 in | Wt 141.0 lb

## 2022-07-14 DIAGNOSIS — L819 Disorder of pigmentation, unspecified: Secondary | ICD-10-CM

## 2022-07-14 DIAGNOSIS — Z719 Counseling, unspecified: Secondary | ICD-10-CM

## 2022-07-14 NOTE — Progress Notes (Signed)
   Subjective:    Patient ID: Robert Edwards, male    DOB: [birth year], 67 y.o.   MRN: [id number]  The patient is a 67 year old male here for follow-up on his face.  He had a lot of hyperpigmentation and has been using the Sente cream.  He has done an amazing job and has a much fresher look to his skin today.  He now has insurance and would like to start thinking about some other treatments.  He will go ahead and use the hydroquinone and then follow-up with [place].  He is probably a good candidate for the laser.  He also has a changing skin lesion on his right shoulder.  He said it has been frozen before and its come back its about a half a centimeter in size.  He would like it removed as it is concerning him.  It is slightly raised as well.      Review of Systems  Constitutional: Negative.   HENT: Negative.    Eyes: Negative.   Respiratory: Negative.    Cardiovascular: Negative.   Gastrointestinal: Negative.   Endocrine: Negative.   Genitourinary: Negative.   Musculoskeletal: Negative.   Skin:  Positive for color change.       Objective:   Physical Exam Vitals and nursing note reviewed.  Constitutional:      Appearance: Normal appearance.  Cardiovascular:     Rate and Rhythm: Normal rate.  Musculoskeletal:        General: No swelling, tenderness, deformity or signs of injury.  Skin:    General: Skin is warm.     Coloration: Skin is not jaundiced or pale.     Findings: No bruising or erythema.  Neurological:     Mental Status: He is alert and oriented to person, place, and time.  Psychiatric:        Mood and Affect: Mood normal.        Behavior: Behavior normal.        Thought Content: Thought content normal.        Judgment: Judgment normal.        Assessment & Plan:     ICD-10-CM   1. Hyperpigmentation of skin  L81.9     2. Changing pigmented skin lesion  L81.9        Plan for excision of right shoulder changing skin lesion of a hyperpigmented lesion.  Also  recommend hydroquinone for his face and then probably laser after that.  Pictures were obtained of the patient and placed in the chart with the patient's or guardian's permission.

## 2022-07-19 ENCOUNTER — Encounter: Payer: Self-pay | Admitting: Plastic Surgery

## 2022-07-19 ENCOUNTER — Other Ambulatory Visit (HOSPITAL_COMMUNITY)
Admission: RE | Admit: 2022-07-19 | Discharge: 2022-07-19 | Disposition: A | Discharge status: Home / Self Care | Payer: Medicare HMO | Source: Ambulatory Visit | Attending: Plastic Surgery | Admitting: Plastic Surgery

## 2022-07-19 ENCOUNTER — Ambulatory Visit: Payer: Medicare HMO | Admitting: Plastic Surgery

## 2022-07-19 VITALS — BP 125/80 | HR 78

## 2022-07-19 DIAGNOSIS — L819 Disorder of pigmentation, unspecified: Secondary | ICD-10-CM | POA: Insufficient documentation

## 2022-07-19 DIAGNOSIS — L821 Other seborrheic keratosis: Secondary | ICD-10-CM

## 2022-07-19 NOTE — Progress Notes (Signed)
Procedure Note  Preoperative Dx: changing skin lesion of right shoulder  Postoperative Dx: Same  Procedure: Excision of changing skin lesion of right shoulder 5 x 12 mm  Anesthesia: Lidocaine 1% with 1:100,000 epinephrine  Indication for Procedure: skin lesion  Description of Procedure: Risks and complications were explained to the patient.  Consent was confirmed and the patient understands the risks and benefits.  The potential complications and alternatives were explained and the patient consents.  The patient expressed understanding the option of not having the procedure and the risks of a scar.  Time out was called and all information was confirmed to be correct.    The area was prepped and drapped.  Lidocaine 1% with epinephrine was injected in the subcutaneous area.  After waiting several minutes for the local to take affect a #15 blade was used to excise the area in an eliptical pattern.  A 5-0 Monocryl was used to close the deep layers with simple interrupted stitches.  The skin edges were reapproximated with 5-0 Monocryl subcuticular running closure.  A dressing was applied.  The patient was given instructions on how to care for the area and a follow up appointment.  Robert Edwards tolerated the procedure well and there were no complications. The specimen was sent to pathology.

## 2022-07-21 LAB — SURGICAL PATHOLOGY

## 2022-07-26 ENCOUNTER — Telehealth: Payer: Self-pay | Admitting: *Deleted

## 2022-07-26 NOTE — Telephone Encounter (Signed)
Called and spoke with the patient and informed him of his recent surgical pathology results below. Patient verbalized understanding and agreed.//AB/CMA

## 2022-07-26 NOTE — Telephone Encounter (Signed)
-----   Message from Peggye Form, DO sent at 07/26/2022  8:45 AM EDT ----- Please let pt know everything is good.  The lesion was benign ----- Message ----- From: Interface, Lab In Three Zero One Sent: 07/21/2022  12:16 PM EDT To: Alena Bills Dillingham, DO

## 2022-08-01 ENCOUNTER — Ambulatory Visit (INDEPENDENT_AMBULATORY_CARE_PROVIDER_SITE_OTHER): Payer: Medicare HMO | Admitting: Plastic Surgery

## 2022-08-01 ENCOUNTER — Encounter: Payer: Self-pay | Admitting: Plastic Surgery

## 2022-08-01 VITALS — BP 117/78 | HR 64 | Resp 18 | Ht 65.0 in | Wt 141.8 lb

## 2022-08-01 DIAGNOSIS — Z9889 Other specified postprocedural states: Secondary | ICD-10-CM

## 2022-08-01 DIAGNOSIS — L819 Disorder of pigmentation, unspecified: Secondary | ICD-10-CM

## 2022-08-01 NOTE — Progress Notes (Signed)
The patient is a 67 year old male here for follow-up after having a lesion removed from his right shoulder.  The area is healing nicely.  I changed Steri-Strip and remove the stitch.  The pathology showed a seborrheic keratosis.  Otherwise negative. He is very pleased.

## 2022-08-05 IMAGING — CT CT HEART MORP W/ CTA COR W/ SCORE W/ CA W/CM &/OR W/O CM
4 of 7 series · 8 of 20 positions shown, 9 images · IV contrast (APPLIED)
Comparison: None.
COMPARISON: None.

Addendum:
EXAM:
OVER-READ INTERPRETATION  CT CHEST

The following report is an over-read performed by radiologist Dr.
Asheer Nakhla [[email or web address]] on 05/10/2021. This
over-read does not include interpretation of cardiac or coronary
anatomy or pathology. The coronary CTA interpretation by the
cardiologist is attached.
CLINICAL DATA: Chest pain
Cardiac/Coronary CTA
TECHNIQUE: A non-contrast, gated CT scan was obtained with axial slices of 3 mm
through the heart for calcium scoring. Calcium scoring was performed
using the Agatston method. A 120 kV prospective, gated, contrast
cardiac scan was obtained. Gantry rotation speed was 250 msecs and
collimation was 0.6 mm. Two sublingual nitroglycerin tablets (0.8
mg) were given. The 3D data set was reconstructed in 5% intervals of
the 35-75% of the R-R cycle. Diastolic phases were analyzed on a
dedicated workstation using MPR, MIP, and VRT modes. The patient
received 95 cc of contrast.

[Series 7: ts diast sharp · axial · 0.36mm/px · z∈[-135,-101]mm · 2 of 256 slices shown]
[im 86/256  lung]
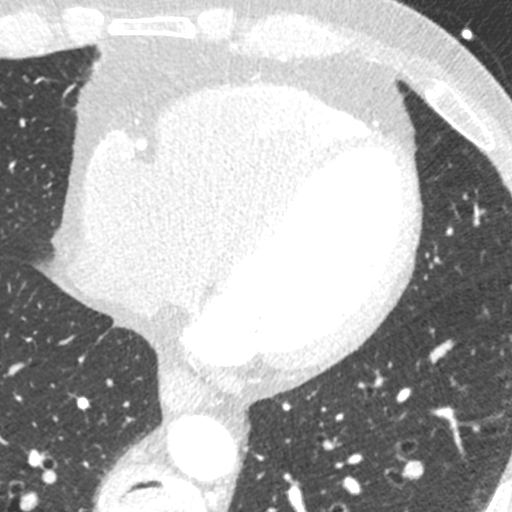
[im 171/256  lung]
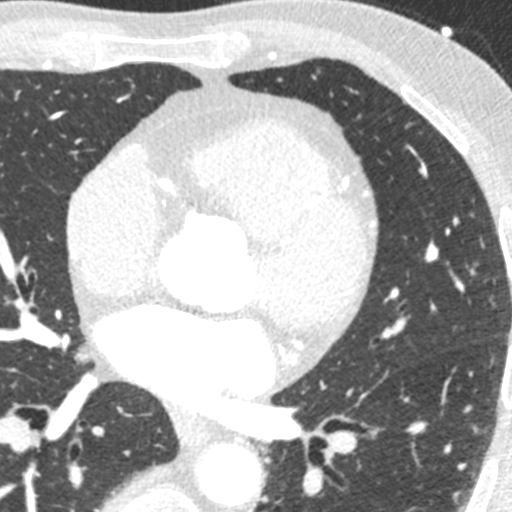

[Series 8: best diast · axial · 0.36mm/px · z∈[-135,-101]mm · 2 of 256 slices shown, 3 images]
[im 86/256  vessel]
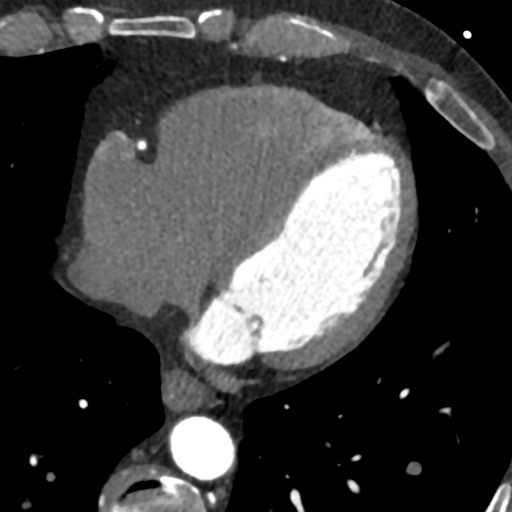
[im 86/256  lung]
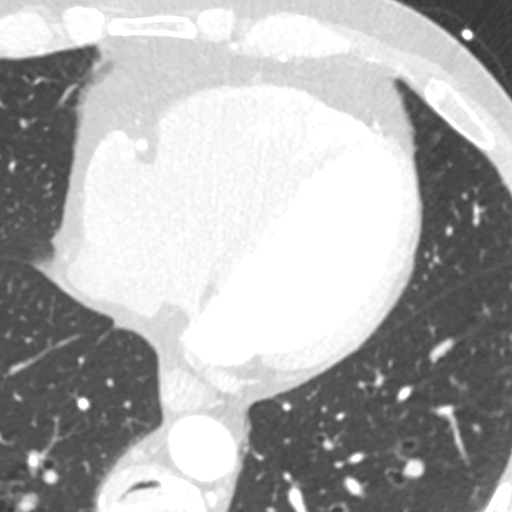
[im 171/256  vessel]
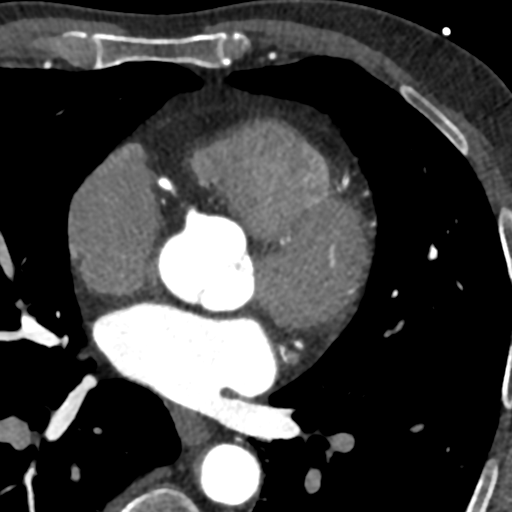

[Series 9: ts syst sharp · axial · 0.36mm/px · z∈[-135,-101]mm · 2 of 256 slices shown]
[im 86/256  lung]
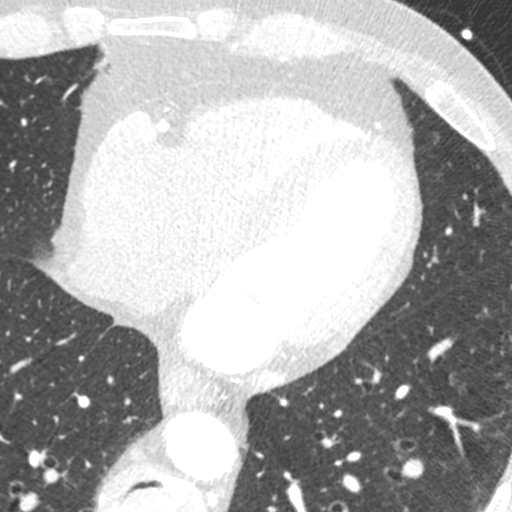
[im 171/256  lung]
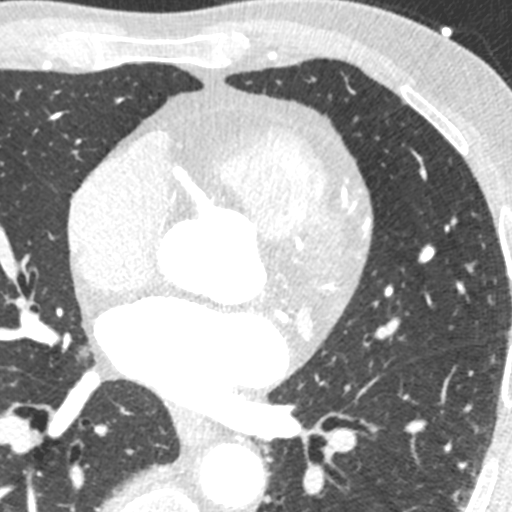

[Series 10: best syst · axial · 0.36mm/px · z∈[-135,-101]mm · 2 of 256 slices shown]
[im 86/256  vessel]
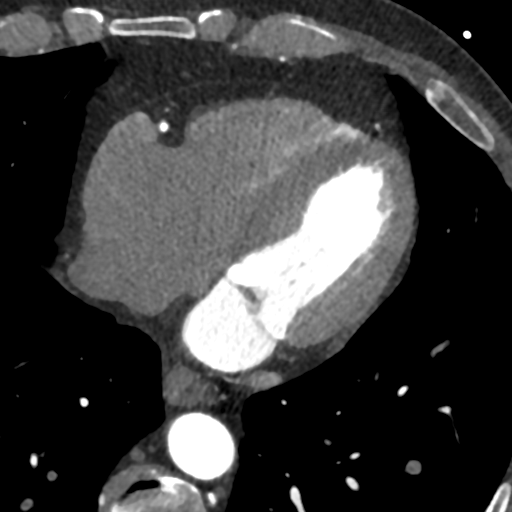
[im 171/256  vessel]
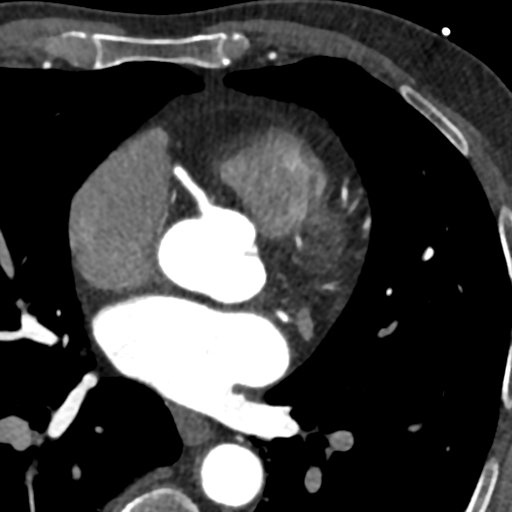

[8 of 20 positions shown; findings below may reference images not displayed]

FINDINGS: Vascular: Mild atherosclerosis of the descending thoracic aorta.

Mediastinum/Nodes: Visualized mediastinum and hilar regions
demonstrate no lymphadenopathy or masses. Probable small hiatal
hernia.

Lungs/Pleura: Visualized lungs show no evidence of pulmonary edema,
consolidation, pneumothorax, nodule or pleural fluid.

Upper Abdomen: Visualized upper liver demonstrates multiple simple
cysts with the largest measuring approximately 1.8 cm in the
superior left lobe.

Musculoskeletal: No chest wall mass or suspicious bone lesions
identified.
IMPRESSION: 1. Atherosclerosis of the thoracic aorta.
2. Benign cysts of the visualized upper liver.
FINDINGS: Image quality: Excellent.

Noise artifact is: Limited.

Coronary Arteries:  Normal coronary origin.  Right dominance.

Left main: The left main is a large caliber vessel with a normal
take off from the left coronary cusp that bifurcates into a LAD and
LCx. There is no plaque or stenosis.

Left anterior descending artery: The LAD gives off 3 patent diagonal
branches. There is moderate calcified and mixed plaque in the
proximal vessel with associated stenosis of 50-69%. There is mild
calcified plaque in the mid vessel with associated stenosis of
25-49%.

Left circumflex artery: The LCX is non-dominant with moderate mixed
plaque in the proximal vessel with associated stenosis of 50-69%.
The LCX gives off 1 patent obtuse marginal branch.

Right coronary artery: The RCA is dominant with normal take off from
the right coronary cusp. There is mild calcified plaque and mixed
plaque in the proximal RCA with associated stenosis of 25-49%. The
RCA terminates as a PDA and right posterolateral branch without
evidence of plaque or stenosis.

Right Atrium: Right atrial size is within normal limits.

Right Ventricle: The right ventricular cavity is within normal
limits.

Left Atrium: Left atrial size is normal in size with no left atrial
appendage filling defect.

Left Ventricle: The ventricular cavity size is within normal limits.
There are no stigmata of prior infarction. There is no abnormal
filling defect.

Pulmonary arteries: Normal in size without proximal filling defect.

Pulmonary veins: Normal pulmonary venous drainage.

Pericardium: Normal thickness with no significant effusion or
calcium present.

Cardiac valves: The aortic valve is trileaflet without significant
calcification. The mitral valve is normal structure without
significant calcification.

Aorta: Normal caliber with no significant disease.

Extra-cardiac findings: See attached radiology report for
non-cardiac structures.
IMPRESSION: 1. Coronary calcium score of 326. This was 80th percentile for age-,
sex, and race-matched controls.

2.  Normal coronary origin with right dominance.

3.  Moderate atherosclerosis.  CAD RADS 3.

4. Consider symptom-guided anti-ischemic and preventive
pharmacotherapy as well as risk factor modification per
guideline-directed care.

5.  This study has been submitted for FFR analysis.

RECOMMENDATIONS:
1. CAD-RADS 0: No evidence of CAD (0%). Consider non-atherosclerotic
causes of chest pain.

2. CAD-RADS 1: Minimal non-obstructive CAD (0-24%). Consider
non-atherosclerotic causes of chest pain. Consider preventive
therapy and risk factor modification.

3. CAD-RADS 2: Mild non-obstructive CAD (25-49%). Consider
non-atherosclerotic causes of chest pain. Consider preventive
therapy and risk factor modification.

4. CAD-RADS 3: Moderate stenosis. Consider symptom-guided
anti-ischemic pharmacotherapy as well as risk factor modification
per guideline directed care. Additional analysis with CT FFR will be
submitted.

5. CAD-RADS 4: Severe stenosis. (70-99% or > 50% left main). Cardiac
catheterization or CT FFR is recommended. Consider symptom-guided
anti-ischemic pharmacotherapy as well as risk factor modification
per guideline directed care. Invasive coronary angiography
recommended with revascularization per published guideline
statements.

6. CAD-RADS 5: Total coronary occlusion (100%). Consider cardiac
catheterization or viability assessment. Consider symptom-guided
anti-ischemic pharmacotherapy as well as risk factor modification
per guideline directed care.

7. CAD-RADS N: Non-diagnostic study. Obstructive CAD can't be
excluded. Alternative evaluation is recommended.

*** End of Addendum ***
EXAM:
OVER-READ INTERPRETATION  CT CHEST

The following report is an over-read performed by radiologist Dr.
Asheer Nakhla [[email or web address]] on 05/10/2021. This
over-read does not include interpretation of cardiac or coronary
anatomy or pathology. The coronary CTA interpretation by the
cardiologist is attached.
FINDINGS: Vascular: Mild atherosclerosis of the descending thoracic aorta.

Mediastinum/Nodes: Visualized mediastinum and hilar regions
demonstrate no lymphadenopathy or masses. Probable small hiatal
hernia.

Lungs/Pleura: Visualized lungs show no evidence of pulmonary edema,
consolidation, pneumothorax, nodule or pleural fluid.

Upper Abdomen: Visualized upper liver demonstrates multiple simple
cysts with the largest measuring approximately 1.8 cm in the
superior left lobe.

Musculoskeletal: No chest wall mass or suspicious bone lesions
identified.
IMPRESSION: 1. Atherosclerosis of the thoracic aorta.
2. Benign cysts of the visualized upper liver.

## 2022-08-15 LAB — EXTERNAL GENERIC LAB PROCEDURE: COLOGUARD: NEGATIVE

## 2022-10-31 ENCOUNTER — Institutional Professional Consult (permissible substitution): Payer: Medicare HMO | Admitting: Plastic Surgery

## 2022-12-25 DIAGNOSIS — M79673 Pain in unspecified foot: Secondary | ICD-10-CM

## 2023-01-12 ENCOUNTER — Institutional Professional Consult (permissible substitution): Payer: Medicare HMO | Admitting: Plastic Surgery

## 2023-02-13 DIAGNOSIS — M79673 Pain in unspecified foot: Secondary | ICD-10-CM

## 2023-03-23 ENCOUNTER — Telehealth: Payer: Self-pay

## 2023-03-23 ENCOUNTER — Institutional Professional Consult (permissible substitution): Payer: Medicare HMO | Admitting: Plastic Surgery

## 2023-03-23 NOTE — Telephone Encounter (Signed)
Spoke with pt and is picking up orthotics next week
